# Patient Record
Sex: Female | Born: 1980 | Race: Black or African American | Hispanic: No | Marital: Single | Smoking: Former smoker
Health system: Southern US, Community
[De-identification: ages and names within clinical notes are randomized; demographics above are authoritative.]

## PROBLEM LIST (undated history)

## (undated) DIAGNOSIS — Z789 Other specified health status: Secondary | ICD-10-CM

## (undated) DIAGNOSIS — E119 Type 2 diabetes mellitus without complications: Secondary | ICD-10-CM

## (undated) DIAGNOSIS — R87629 Unspecified abnormal cytological findings in specimens from vagina: Secondary | ICD-10-CM

## (undated) DIAGNOSIS — Z5189 Encounter for other specified aftercare: Secondary | ICD-10-CM

## (undated) DIAGNOSIS — O139 Gestational [pregnancy-induced] hypertension without significant proteinuria, unspecified trimester: Secondary | ICD-10-CM

---

## 2019-07-08 NOTE — L&D Delivery Note (Signed)
Delivery Note Pt pushed for three sets of contractions over and at 4:40 PM a viable female was delivered via Vaginal, Spontaneous (Presentation: Left Occiput Anterior).  APGAR:pending per NICU , ; weight pending  After delivery of infants head, no nuchal was noted. Anterior and posterior shoulders followed then body easily next.  .   Placenta status: Spontaneous;Expressed;Schultz. Pathology, Intact.  Cord: 3 vessels with the following complications: None.  Cord pH: n/a  Anesthesia: Epidural Episiotomy: None Lacerations: None Suture Repair: n/a Est. Blood Loss (mL): 100  Mom to postpartum.  Baby to NICU  .  Debra Roach 03/06/2020, 4:51 PM

## 2019-09-08 LAB — OB RESULTS CONSOLE GC/CHLAMYDIA
Chlamydia: NEGATIVE
Gonorrhea: NEGATIVE

## 2020-01-26 LAB — OB RESULTS CONSOLE RPR: RPR: NONREACTIVE

## 2020-01-26 LAB — OB RESULTS CONSOLE HIV ANTIBODY (ROUTINE TESTING): HIV: NONREACTIVE

## 2020-03-01 ENCOUNTER — Inpatient Hospital Stay (HOSPITAL_COMMUNITY)
Admission: AD | Admit: 2020-03-01 | Discharge: 2020-03-09 | DRG: 805 | Disposition: A | Discharge status: Home / Self Care | Payer: Medicaid Other | Attending: Obstetrics and Gynecology | Admitting: Obstetrics and Gynecology

## 2020-03-01 ENCOUNTER — Other Ambulatory Visit: Payer: Self-pay

## 2020-03-01 ENCOUNTER — Encounter (HOSPITAL_COMMUNITY): Payer: Self-pay | Admitting: Obstetrics and Gynecology

## 2020-03-01 ENCOUNTER — Inpatient Hospital Stay (HOSPITAL_COMMUNITY)
Admission: AD | Admit: 2020-03-01 | Discharge: 2020-03-01 | Disposition: A | Discharge status: Home / Self Care | Payer: Medicaid Other | Attending: Obstetrics and Gynecology | Admitting: Obstetrics and Gynecology

## 2020-03-01 DIAGNOSIS — O42913 Preterm premature rupture of membranes, unspecified as to length of time between rupture and onset of labor, third trimester: Secondary | ICD-10-CM | POA: Diagnosis present

## 2020-03-01 DIAGNOSIS — Z794 Long term (current) use of insulin: Secondary | ICD-10-CM | POA: Diagnosis not present

## 2020-03-01 DIAGNOSIS — O4290 Premature rupture of membranes, unspecified as to length of time between rupture and onset of labor, unspecified weeks of gestation: Secondary | ICD-10-CM | POA: Diagnosis not present

## 2020-03-01 DIAGNOSIS — Z3A33 33 weeks gestation of pregnancy: Secondary | ICD-10-CM | POA: Diagnosis not present

## 2020-03-01 DIAGNOSIS — O134 Gestational [pregnancy-induced] hypertension without significant proteinuria, complicating childbirth: Secondary | ICD-10-CM | POA: Diagnosis present

## 2020-03-01 DIAGNOSIS — O09293 Supervision of pregnancy with other poor reproductive or obstetric history, third trimester: Secondary | ICD-10-CM | POA: Diagnosis not present

## 2020-03-01 DIAGNOSIS — Z87891 Personal history of nicotine dependence: Secondary | ICD-10-CM | POA: Diagnosis not present

## 2020-03-01 DIAGNOSIS — O411230 Chorioamnionitis, third trimester, not applicable or unspecified: Secondary | ICD-10-CM | POA: Diagnosis present

## 2020-03-01 DIAGNOSIS — O09213 Supervision of pregnancy with history of pre-term labor, third trimester: Secondary | ICD-10-CM | POA: Diagnosis not present

## 2020-03-01 DIAGNOSIS — E119 Type 2 diabetes mellitus without complications: Secondary | ICD-10-CM | POA: Diagnosis present

## 2020-03-01 DIAGNOSIS — O864 Pyrexia of unknown origin following delivery: Secondary | ICD-10-CM

## 2020-03-01 DIAGNOSIS — O2412 Pre-existing diabetes mellitus, type 2, in childbirth: Secondary | ICD-10-CM | POA: Diagnosis present

## 2020-03-01 DIAGNOSIS — O24113 Pre-existing diabetes mellitus, type 2, in pregnancy, third trimester: Secondary | ICD-10-CM | POA: Diagnosis not present

## 2020-03-01 DIAGNOSIS — Z20822 Contact with and (suspected) exposure to covid-19: Secondary | ICD-10-CM | POA: Diagnosis present

## 2020-03-01 DIAGNOSIS — O42919 Preterm premature rupture of membranes, unspecified as to length of time between rupture and onset of labor, unspecified trimester: Secondary | ICD-10-CM | POA: Diagnosis present

## 2020-03-01 HISTORY — DX: Other specified health status: Z78.9

## 2020-03-01 HISTORY — DX: Encounter for other specified aftercare: Z51.89

## 2020-03-01 HISTORY — DX: Gestational (pregnancy-induced) hypertension without significant proteinuria, unspecified trimester: O13.9

## 2020-03-01 HISTORY — DX: Type 2 diabetes mellitus without complications: E11.9

## 2020-03-01 HISTORY — DX: Unspecified abnormal cytological findings in specimens from vagina: R87.629

## 2020-03-01 LAB — URIC ACID: Uric Acid, Serum: 4.6 mg/dL (ref 2.5–7.1)

## 2020-03-01 LAB — PROTEIN / CREATININE RATIO, URINE
Creatinine, Urine: 25.2 mg/dL
Total Protein, Urine: <6 mg/dL

## 2020-03-01 LAB — COMPREHENSIVE METABOLIC PANEL
ALT: 12 U/L (ref 0–44)
AST: 12 U/L — ABNORMAL LOW (ref 15–41)
Albumin: 2.6 g/dL — ABNORMAL LOW (ref 3.5–5.0)
Alkaline Phosphatase: 85 U/L (ref 38–126)
Anion gap: 12 (ref 5–15)
BUN: 5 mg/dL — ABNORMAL LOW (ref 6–20)
CO2: 20 mmol/L — ABNORMAL LOW (ref 22–32)
Calcium: 8.6 mg/dL — ABNORMAL LOW (ref 8.9–10.3)
Chloride: 104 mmol/L (ref 98–111)
Creatinine, Ser: 0.46 mg/dL (ref 0.44–1.00)
GFR calc Af Amer: >60 mL/min (ref >60)
GFR calc non Af Amer: >60 mL/min (ref >60)
Glucose, Bld: 76 mg/dL (ref 70–99)
Potassium: 3.5 mmol/L (ref 3.5–5.1)
Sodium: 136 mmol/L (ref 135–145)
Total Bilirubin: 0.4 mg/dL (ref 0.3–1.2)
Total Protein: 6.5 g/dL (ref 6.5–8.1)

## 2020-03-01 LAB — GLUCOSE, CAPILLARY
Glucose-Capillary: 67 mg/dL — ABNORMAL LOW (ref 70–99)
Glucose-Capillary: 150 mg/dL — ABNORMAL HIGH (ref 70–99)

## 2020-03-01 LAB — CBC
HCT: 36 % (ref 36.0–46.0)
Hemoglobin: 11.7 g/dL — ABNORMAL LOW (ref 12.0–15.0)
MCH: 26.6 pg (ref 26.0–34.0)
MCHC: 32.5 g/dL (ref 30.0–36.0)
MCV: 81.8 fL (ref 80.0–100.0)
Platelets: 296 10*3/uL (ref 150–400)
RBC: 4.4 MIL/uL (ref 3.87–5.11)
RDW: 14.3 % (ref 11.5–15.5)
WBC: 9.5 10*3/uL (ref 4.0–10.5)
nRBC: 0 % (ref 0.0–0.2)

## 2020-03-01 LAB — TYPE AND SCREEN
ABO/RH(D): O POS
Antibody Screen: NEGATIVE

## 2020-03-01 LAB — LACTATE DEHYDROGENASE: LDH: 114 U/L (ref 98–192)

## 2020-03-01 MED ORDER — LABETALOL HCL 5 MG/ML IV SOLN: 40.0000 mg | INTRAVENOUS | Status: DC | PRN

## 2020-03-01 MED ORDER — DOCUSATE SODIUM 100 MG PO CAPS
100.0000 mg | ORAL_CAPSULE | Freq: Every day | ORAL | Status: DC
  Administered 2020-03-04: 100 mg via ORAL
  Filled 2020-03-01: qty 1

## 2020-03-01 MED ORDER — LACTATED RINGERS IV SOLN: INTRAVENOUS | Status: DC

## 2020-03-01 MED ORDER — AZITHROMYCIN 250 MG PO TABS
1000.0000 mg | ORAL_TABLET | Freq: Once | ORAL | Status: AC
  Administered 2020-03-01: 1000 mg via ORAL
  Filled 2020-03-01: qty 4

## 2020-03-01 MED ORDER — INSULIN ASPART 100 UNIT/ML ~~LOC~~ SOLN: 0.0000 [IU] | Freq: Three times a day (TID) | SUBCUTANEOUS | Status: DC

## 2020-03-01 MED ORDER — INSULIN ASPART 100 UNIT/ML ~~LOC~~ SOLN
3.0000 [IU] | Freq: Three times a day (TID) | SUBCUTANEOUS | Status: DC
  Administered 2020-03-01: 9 [IU] via SUBCUTANEOUS

## 2020-03-01 MED ORDER — INSULIN DETEMIR 100 UNIT/ML ~~LOC~~ SOLN
85.0000 [IU] | Freq: Every day | SUBCUTANEOUS | Status: DC
  Administered 2020-03-02 – 2020-03-05 (×5): 85 [IU] via SUBCUTANEOUS
  Filled 2020-03-01 (×5): qty 0.85

## 2020-03-01 MED ORDER — LABETALOL HCL 5 MG/ML IV SOLN: 80.0000 mg | INTRAVENOUS | Status: DC | PRN

## 2020-03-01 MED ORDER — BETAMETHASONE SOD PHOS & ACET 6 (3-3) MG/ML IJ SUSP
12.0000 mg | INTRAMUSCULAR | Status: AC
  Administered 2020-03-01 – 2020-03-02 (×2): 12 mg via INTRAMUSCULAR
  Filled 2020-03-01: qty 5

## 2020-03-01 MED ORDER — ACETAMINOPHEN 325 MG PO TABS
650.0000 mg | ORAL_TABLET | ORAL | Status: DC | PRN
  Administered 2020-03-01 – 2020-03-04 (×5): 650 mg via ORAL
  Filled 2020-03-01 (×5): qty 2

## 2020-03-01 MED ORDER — MAGNESIUM SULFATE 40 GM/1000ML IV SOLN
INTRAVENOUS | Status: AC
  Filled 2020-03-01: qty 1000

## 2020-03-01 MED ORDER — INSULIN ASPART 100 UNIT/ML ~~LOC~~ SOLN
0.0000 [IU] | Freq: Three times a day (TID) | SUBCUTANEOUS | Status: DC
  Administered 2020-03-02: 8 [IU] via SUBCUTANEOUS
  Administered 2020-03-02: 6 [IU] via SUBCUTANEOUS

## 2020-03-01 MED ORDER — INSULIN ASPART 100 UNIT/ML ~~LOC~~ SOLN: 3.0000 [IU] | Freq: Three times a day (TID) | SUBCUTANEOUS | Status: DC

## 2020-03-01 MED ORDER — AMOXICILLIN 500 MG PO CAPS
500.0000 mg | ORAL_CAPSULE | Freq: Three times a day (TID) | ORAL | Status: DC
  Administered 2020-03-03 – 2020-03-05 (×7): 500 mg via ORAL
  Filled 2020-03-01 (×7): qty 1

## 2020-03-01 MED ORDER — LABETALOL HCL 5 MG/ML IV SOLN
20.0000 mg | INTRAVENOUS | Status: DC | PRN
  Administered 2020-03-03: 20 mg via INTRAVENOUS
  Filled 2020-03-01: qty 4

## 2020-03-01 MED ORDER — HYDRALAZINE HCL 20 MG/ML IJ SOLN: 10.0000 mg | INTRAMUSCULAR | Status: DC | PRN

## 2020-03-01 MED ORDER — MAGNESIUM SULFATE 40 GM/1000ML IV SOLN
2.0000 g/h | INTRAVENOUS | Status: AC
  Administered 2020-03-02: 2 g/h via INTRAVENOUS
  Filled 2020-03-01: qty 1000

## 2020-03-01 MED ORDER — SODIUM CHLORIDE 0.9 % IV SOLN
2.0000 g | Freq: Four times a day (QID) | INTRAVENOUS | Status: AC
  Administered 2020-03-01 – 2020-03-03 (×8): 2 g via INTRAVENOUS
  Filled 2020-03-01 (×5): qty 2000
  Filled 2020-03-01: qty 2
  Filled 2020-03-01 (×5): qty 2000

## 2020-03-01 MED ORDER — INSULIN DETEMIR 100 UNIT/ML ~~LOC~~ SOLN
75.0000 [IU] | Freq: Two times a day (BID) | SUBCUTANEOUS | Status: DC
  Filled 2020-03-01 (×2): qty 0.75

## 2020-03-01 MED ORDER — METFORMIN HCL ER 500 MG PO TB24
500.0000 mg | ORAL_TABLET | Freq: Two times a day (BID) | ORAL | Status: DC
  Administered 2020-03-02 – 2020-03-05 (×8): 500 mg via ORAL
  Filled 2020-03-01 (×10): qty 1

## 2020-03-01 MED ORDER — PRENATAL MULTIVITAMIN CH
1.0000 | ORAL_TABLET | Freq: Every day | ORAL | Status: DC
  Administered 2020-03-02 – 2020-03-05 (×4): 1 via ORAL
  Filled 2020-03-01 (×4): qty 1

## 2020-03-01 MED ORDER — CALCIUM CARBONATE ANTACID 500 MG PO CHEW: 2.0000 | CHEWABLE_TABLET | ORAL | Status: DC | PRN

## 2020-03-01 MED ORDER — INSULIN DETEMIR 100 UNIT/ML ~~LOC~~ SOLN
75.0000 [IU] | Freq: Every day | SUBCUTANEOUS | Status: DC
  Administered 2020-03-02 – 2020-03-05 (×4): 75 [IU] via SUBCUTANEOUS
  Filled 2020-03-01 (×5): qty 0.75

## 2020-03-01 MED ORDER — MAGNESIUM SULFATE BOLUS VIA INFUSION
6.0000 g | Freq: Once | INTRAVENOUS | Status: AC
  Administered 2020-03-01: 6 g via INTRAVENOUS
  Filled 2020-03-01: qty 1000

## 2020-03-01 MED ORDER — ZOLPIDEM TARTRATE 5 MG PO TABS: 5.0000 mg | ORAL_TABLET | Freq: Every evening | ORAL | Status: DC | PRN

## 2020-03-01 NOTE — Consult Note (Signed)
[facility]  Prenatal Consult       03/01/2020  9:32 PM   I was asked by Dr. Mindi Slicker to consult on this patient for possible preterm delivery.  I had the pleasure of meeting with Ms. Debra Roach today.  She is a 40 y.I.[id number] with pregnancy complicated by T2DM (insulin and metformin) and PIH.  She PPROM'ed today at 33+2 and is being admitted for monitoring until delivery as well as betamethasone, neuro protective magnesium, and antibiotic administration.   She has had a prior preterm delivery at 30 weeks.     I explained that the neonatal intensive care team would be present for the delivery and that her daughter would automatically be admitted to the NICU if born prior to [redacted] weeks gestation.  We briefly outlined the likely delivery room course for this baby including routine resuscitation and NRP-guided approaches to the treatment of respiratory distress. We discussed other common problems associated with late prematurity, including respiratory distress syndrome/CLD, apnea, feeding issues, temperature regulation, and infection risk.   We discussed the average length of stay but I noted that the actual LOS would depend on the severity of problems encountered and response to treatments.  We discussed visitation policies and the resources available while her child is in the hospital.  We discussed the importance of good nutrition and various methods of providing nutrition (parenteral hyperalimentation, gavage feedings and/or oral feeding). We discussed the benefits of human milk. I encouraged breast feeding and pumping soon after birth and outlined resources that are available to support breast feeding.   Thank you for involving [place] in the care of this patient. A member of our team will be available should the family have additional questions.  Time for consultation approximately 20 minutes.   _____________________ Electronically Signed By: Karie Schwalbe, MD, MS Neonatologist

## 2020-03-01 NOTE — Progress Notes (Signed)
No history of surgery

## 2020-03-01 NOTE — Progress Notes (Signed)
CBC not resulted

## 2020-03-01 NOTE — Progress Notes (Signed)
Pt arrived to floor ambulatory after driving to hospital from MD's office. Pt is 39yr old G3P2 at 76 weeks with an [facility] of April 17, 2020.  Pt reports that she is a Type 2 diabetic on insulin and metformin. Pt reports that she has not had a meal since this am and had a snack at 1245 of cheese stick and applesauce. Pt states that she had been diagnosis as gestational hypertension with current admission from MD today of pre eclampsia. Pt reports she has been Type 2 diabetic x 15 years and takes Levemir and metformin daily. Bedside glucose 67. Pt reports current frontal headache, denies visual changes, spots or flashes of light, denies epigastric pain. Pt reports last HgbA1c 6.6 the beginning of August. Pt reports SROM at 1130 this morning-clear fluid-denies foul odor. Uterus is soft and non tender to palpation. Pt reports + fetal movement. Pt denies vaginal bleeding or bloody show. External fetal and labor monitors applied. Pt is calm and denies pain, cramping or contractions.

## 2020-03-01 NOTE — H&P (Addendum)
Debra Roach is a 39 y.B.[id number]  female presenting for PPROM, preterm contractions, Type 2 DM and elevated BP with HA.  Pt was seen in office today where Debra Roach was confirmed to be PPROM via gross pooling and positive nitrazine. On exam Debra Roach was 1cm dilated. Pt reported HA and elevated BP; history of preE in previous pregnancy and ghtn with this pregnancy. Pt is also type  2 diabetic - managed by Dr Talmage Nap; on metformin 500mg  po bid, levemir 75mg  bid and 3-10u novolog q meals prn with moderate control. Debra Roach has a history of preterm delivery - was on prometrium. Debra Roach is  VZNI.  Pt is dated per 8 week .  OB History    Gravida  4   Para  2   Term  1   Preterm  1   AB  1   Living  2     SAB  1   TAB  0   Ectopic  0   Multiple  0   Live Births  2          Past Medical History:  Diagnosis Date  . Blood transfusion without reported diagnosis    following miscarriage 2007  . Diabetes mellitus without complication (HCC)    Type 2 diabetes x 15 years  . Medical history non-contributory   . Pregnancy induced hypertension   . Preterm labor    history of preterm delivery at 30 weeks with 1st child  . Vaginal Pap smear, abnormal    in high school not surgrically treated   History reviewed. No pertinent surgical history. Family History: family history is not on file. Social History:  reports that Debra Roach quit smoking about 6 months ago. Debra Roach has never used smokeless tobacco. Debra Roach reports that Debra Roach does not drink alcohol and does not use drugs.     Maternal Diabetes: Yes:  Diabetes Type:  Pre-pregnancy, Insulin/Medication controlled Genetic Screening: Normal Maternal Ultrasounds/Referrals: Normal Fetal Ultrasounds or other Referrals:  Fetal echo Maternal Substance Abuse:  No Significant Maternal Medications:  Meds include: Other: see HPI Significant Maternal Lab Results:  Other: GBS unk Other Comments:  None  Review of Systems  Constitutional: Positive for activity change,  appetite change and fatigue. Negative for fever.  HENT: Negative for congestion.   Eyes: Positive for photophobia.  Respiratory: Negative for chest tightness and shortness of breath.   Cardiovascular: Negative for chest pain, palpitations and leg swelling.  Gastrointestinal: Positive for abdominal pain.  Musculoskeletal: Positive for myalgias.  Neurological: Positive for headaches.  Psychiatric/Behavioral: The patient is not nervous/anxious.    Maternal Medical History:  Reason for admission: Rupture of membranes.  Headache with elevated BP  Contractions: Onset was 13-24 hours ago.   Frequency: irregular.   Perceived severity is moderate.    Fetal activity: Perceived fetal activity is normal.   Last perceived fetal movement was within the past hour.    Prenatal complications: PIH and preterm labor.   Prenatal Complications - Diabetes: type 2. Diabetes is managed by insulin injections and oral agent (monotherapy).        Blood pressure 139/68, pulse 81, temperature 98.9 F (37.2 C), temperature source Oral, resp. rate 18, height 5\' 6"  (1.676 m), weight 89.9 kg, SpO2 99 %. Maternal Exam:  Uterine Assessment: Contraction strength is mild.  Contraction frequency is irregular.   Abdomen: Patient reports generalized tenderness.  Estimated fetal weight is AGA.   Fetal presentation: vertex  Introitus: Normal vulva. Vulva is negative for condylomata and lesion.  Normal vagina.  Vagina is negative for condylomata and discharge.  Nitrazine test: positive. Amniotic fluid character: clear.  Pelvis: adequate for delivery.   Cervix: Cervix evaluated by digital exam.     Fetal Exam Fetal Monitor Review: Baseline rate: 130.  Variability: moderate (6-25 bpm).   Pattern: accelerations present and no decelerations.    Fetal State Assessment: Category I - tracings are normal.     Physical Exam Vitals and nursing note reviewed. Exam conducted with a chaperone present.   Constitutional:      General: Debra Roach is not in acute distress.    Appearance: Normal appearance. Debra Roach is normal weight.  Cardiovascular:     Pulses: Normal pulses.  Pulmonary:     Effort: Pulmonary effort is normal.  Abdominal:     Tenderness: There is generalized abdominal tenderness.  Genitourinary:    General: Normal vulva.  Vulva is no lesion.     Vagina: No vaginal discharge.  Musculoskeletal:        General: Normal range of motion.     Cervical back: Normal range of motion.  Skin:    General: Skin is warm and dry.     Capillary Refill: Capillary refill takes 2 to 3 seconds.  Neurological:     General: No focal deficit present.     Mental Status: Debra Roach is alert and oriented to person, place, and time.  Psychiatric:        Mood and Affect: Mood normal.        Behavior: Behavior normal.        Thought Content: Thought content normal.        Judgment: Judgment normal.     Prenatal labs: ABO, Rh:   Antibody:   Rubella:   RPR:    HBsAg:    HIV:    GBS:     Assessment/Plan: 39yo [id number] female at 5 2/7wks with PPROM, type 2DM, ghtn, hx preterm labor - Admit - MgSO4 : 6gm bolus ( 1636) then run at 2gm/hr for CP prophylaxis ( started at 1811) - stop after 24hrs - Antibiotics for infection prevention prophylactically given PPROM - amp/zithro x 7 days - Parameters for preE mgmt : labs ordered and labetalol protocol if indicated - NICU consult - possible preterm delivery - MFM consult - [place]  - Fetal monitoring q shift and prn  - Diabetic mgmt - meds, consult to coordinator and dietitian - BMZ today and tomorrow ( 1800) - GBS obtained - Close monitoring    Janean Sark Codey Burling 03/01/2020, 7:00 PM

## 2020-03-01 NOTE — Progress Notes (Signed)
Patient ID: Debra Roach, female   DOB: 03-04-1981, 39 y.o.   MRN: [id number] Pt doing well. Reports back pain improving. No complaints. +FMs. Boyfriend received letter and is now n route to [place] VSS: 135/65 GEN - NAD  EFM - cat 1 TOCO - no contractions noted  ALT - 12; AST - 12.  LDH 114,  9.5>11.7<296  GLucose 150  A/P: 39yo [id number] at 33 2/7wks with PPROM, Type 2DM - stable on MgSO4 for CP prophylaxis and s/p BMZ x 1         Continue plan per H/P

## 2020-03-02 ENCOUNTER — Inpatient Hospital Stay (HOSPITAL_BASED_OUTPATIENT_CLINIC_OR_DEPARTMENT_OTHER): Payer: Medicaid Other

## 2020-03-02 DIAGNOSIS — O4290 Premature rupture of membranes, unspecified as to length of time between rupture and onset of labor, unspecified weeks of gestation: Secondary | ICD-10-CM | POA: Diagnosis not present

## 2020-03-02 DIAGNOSIS — O24113 Pre-existing diabetes mellitus, type 2, in pregnancy, third trimester: Secondary | ICD-10-CM

## 2020-03-02 DIAGNOSIS — O09213 Supervision of pregnancy with history of pre-term labor, third trimester: Secondary | ICD-10-CM

## 2020-03-02 DIAGNOSIS — O09293 Supervision of pregnancy with other poor reproductive or obstetric history, third trimester: Secondary | ICD-10-CM | POA: Diagnosis not present

## 2020-03-02 DIAGNOSIS — Z3A33 33 weeks gestation of pregnancy: Secondary | ICD-10-CM

## 2020-03-02 DIAGNOSIS — O133 Gestational [pregnancy-induced] hypertension without significant proteinuria, third trimester: Secondary | ICD-10-CM

## 2020-03-02 LAB — GLUCOSE, CAPILLARY
Glucose-Capillary: 215 mg/dL — ABNORMAL HIGH (ref 70–99)
Glucose-Capillary: 224 mg/dL — ABNORMAL HIGH (ref 70–99)
Glucose-Capillary: 250 mg/dL — ABNORMAL HIGH (ref 70–99)
Glucose-Capillary: 253 mg/dL — ABNORMAL HIGH (ref 70–99)
Glucose-Capillary: 316 mg/dL — ABNORMAL HIGH (ref 70–99)

## 2020-03-02 LAB — SARS CORONAVIRUS 2 BY RT PCR (HOSPITAL ORDER, PERFORMED IN ~~LOC~~ HOSPITAL LAB): SARS Coronavirus 2: NEGATIVE

## 2020-03-02 MED ORDER — INSULIN ASPART 100 UNIT/ML ~~LOC~~ SOLN
0.0000 [IU] | Freq: Three times a day (TID) | SUBCUTANEOUS | Status: DC
  Administered 2020-03-03: 5 [IU] via SUBCUTANEOUS

## 2020-03-02 MED ORDER — INSULIN ASPART 100 UNIT/ML ~~LOC~~ SOLN
8.0000 [IU] | Freq: Three times a day (TID) | SUBCUTANEOUS | Status: DC
  Administered 2020-03-02 (×2): 8 [IU] via SUBCUTANEOUS

## 2020-03-02 MED ORDER — INSULIN ASPART 100 UNIT/ML ~~LOC~~ SOLN
10.0000 [IU] | Freq: Three times a day (TID) | SUBCUTANEOUS | Status: DC
  Administered 2020-03-02 – 2020-03-04 (×5): 10 [IU] via SUBCUTANEOUS

## 2020-03-02 NOTE — Progress Notes (Signed)
Initial Nutrition Assessment  DOCUMENTATION CODES:  Not applicable  INTERVENTION:  Gestational diabetic carbohydrate modified diet May order double protein portions  NUTRITION DIAGNOSIS:  Altered nutrition lab value related to  (Hx of DM II and BMZ) as evidenced by  (lab values/ glucose).  GOAL:  Patient will meet greater than or equal to 90% of their needs  MONITOR:  Labs, Weight trends  REASON FOR ASSESSMENT:  Gestational Diabetes, Antenatal    ASSESSMENT:  33 3/7 weeks, adn with PROM, Hx of DMII. pre-preg weight 163 lbs, BMI 26.3. 35 lb weight gain to date. Elevated glucose levels currently due to BMZ. Has had outpt endocrine follow-up  Pt in house until delivery. Has no questions about diet. Reports she is comfortable counting CHO's and is able to relay CHO diet limits to me. Had no problems ordering meals. Appetite excellent this morning    Diet Order:   Diet Order            Diet gestational carb mod Room service appropriate? Yes; Fluid consistency: Thin  Diet effective now                EDUCATION NEEDS:   No education needs have been identified at this time  Skin:  Skin Assessment: Reviewed RN Assessment  Height:   Ht Readings from Last 1 Encounters:  03/01/20 5\' 6"  (1.676 m)    Weight:   Wt Readings from Last 1 Encounters:  03/01/20 89.9 kg   Ideal Body Weight:     BMI:  Body mass index is 31.99 kg/m.  Estimated Nutritional Needs:   Kcal:  2000-2200  Protein:  90-100 g  Fluid:  2.3 L  03/03/20 M.M LDN Neonatal Nutrition Support Specialist/RD III

## 2020-03-02 NOTE — Progress Notes (Addendum)
Patient ID: Debra Roach, female   DOB: [birth year], 39 y.o.   MRN: [id number]  PPROM 33+3, Also GDM  +FM, cont LOF, clear, no VB, occ ctx, pressure.  This am had lots of cramping pain in back for 10+ minute period, noe releived.  Going for [place] this AM.  Mild HA this AM hasn't eaten, usually drinks coffee in AM.  AFVSS (124-164/60-99 - now 134/69) gen NAD FHTs 120's, mod var, + accels, category 1 toco q 7-107min  Cont current mgmt Getting BMZ Getting Magnesium for CP prophylaxis [place] this AM Sugars elevated - will add sliding scale insulin Close monitoring

## 2020-03-02 NOTE — Progress Notes (Signed)
Inpatient Diabetes Program Recommendations  AACE/ADA: New Consensus Statement on Inpatient Glycemic Control (2015)  Target Ranges:  Prepandial:   less than 140 mg/dL      Peak postprandial:   less than 180 mg/dL (1-2 hours)      Critically ill patients:  140 - 180 mg/dL   Lab Results  Component Value Date   GLUCAP 224 (H) 03/02/2020    Review of Glycemic Control Results for HINDY, PERRAULT (MRN [id number]) as of 03/02/2020 10:18  Ref. Range 03/01/2020 20:37 03/02/2020 00:34 03/02/2020 06:39  Glucose-Capillary Latest Ref Range: 70 - 99 mg/dL 431 (H) 540 (H) 086 (H)   Diabetes history: Type 2 DM Outpatient Diabetes medications: Levemir 75 units QD, 85 units QHS, Metformin 500 mg BID,  Novolog TID meal coverage: (70-100) 8 units, (100-125) 9 units, (126-150) 10 units, (+150) 10+1 units for every 25 above Current orders for Inpatient glycemic control: Levemir 75 units QAM,, 85 units QHS, Novolog 3-10 units TID, Novolog 0-16 units TID, Metformin 500 mg BID  BMZ 12 mg X 1 (of 2) 8/26   Inpatient Diabetes Program Recommendations:    Noted elevated glucose trends, assuming related to steroids administration. Of note, patient missed AM dose of meal coverage with breakfast. Based on outpatient insulin needs for CHO coverage would recommend changing Novolog to 8 units TID (assuming patient is consuming >50% of meals).   Spoke with patient regarding outpatient diabetes management. Patient is followed by Dr Talmage Nap, endocrinology with last appointment on 8/4 with A1C 6.6%.  Patient reports taking a minimum of Novolog 8 units with meals based on her glucose trends prior to meal time and adjusts based on CBG range. Per patient report, she has done much better in pregnancy with glucose control than prior.  Reviewed patho of DM in pregnancy, impact of hormonal fluctuations on glucose trends, steroids, vascular changes and commorbidities. Encouraged to continue monitoring and follow up with Dr Talmage Nap even  following delivery. Reviewed common reductions to insulin dosages and insulin needs following delivery of placenta.  Reviewed CHO intake and alottment per meal as well las some other alternatives for snacks while inpatient. Patient has no further questions at this time.  Discussed patient with Dr Ellyn Hack, orders updated.  Thanks, Lujean Rave, MSN, RNC-OB Diabetes Coordinator [phone] (8a-5p)

## 2020-03-03 LAB — CBC WITH DIFFERENTIAL/PLATELET
Abs Immature Granulocytes: 0.06 10*3/uL (ref 0.00–0.07)
Basophils Absolute: 0 10*3/uL (ref 0.0–0.1)
Basophils Relative: 0 %
Eosinophils Absolute: 0 10*3/uL (ref 0.0–0.5)
Eosinophils Relative: 0 %
HCT: 34.2 % — ABNORMAL LOW (ref 36.0–46.0)
Hemoglobin: 11 g/dL — ABNORMAL LOW (ref 12.0–15.0)
Immature Granulocytes: 1 %
Lymphocytes Relative: 16 %
Lymphs Abs: 1.7 10*3/uL (ref 0.7–4.0)
MCH: 26.6 pg (ref 26.0–34.0)
MCHC: 32.2 g/dL (ref 30.0–36.0)
MCV: 82.8 fL (ref 80.0–100.0)
Monocytes Absolute: 0.6 10*3/uL (ref 0.1–1.0)
Monocytes Relative: 6 %
Neutro Abs: 8.4 10*3/uL — ABNORMAL HIGH (ref 1.7–7.7)
Neutrophils Relative %: 77 %
Platelets: 313 10*3/uL (ref 150–400)
RBC: 4.13 MIL/uL (ref 3.87–5.11)
RDW: 14.5 % (ref 11.5–15.5)
WBC: 10.9 10*3/uL — ABNORMAL HIGH (ref 4.0–10.5)
nRBC: 0 % (ref 0.0–0.2)

## 2020-03-03 LAB — COMPREHENSIVE METABOLIC PANEL
ALT: 11 U/L (ref 0–44)
AST: 14 U/L — ABNORMAL LOW (ref 15–41)
Albumin: 2.6 g/dL — ABNORMAL LOW (ref 3.5–5.0)
Alkaline Phosphatase: 73 U/L (ref 38–126)
Anion gap: 10 (ref 5–15)
BUN: 9 mg/dL (ref 6–20)
CO2: 19 mmol/L — ABNORMAL LOW (ref 22–32)
Calcium: 8.5 mg/dL — ABNORMAL LOW (ref 8.9–10.3)
Chloride: 107 mmol/L (ref 98–111)
Creatinine, Ser: 0.51 mg/dL (ref 0.44–1.00)
GFR calc Af Amer: >60 mL/min (ref >60)
GFR calc non Af Amer: >60 mL/min (ref >60)
Glucose, Bld: 150 mg/dL — ABNORMAL HIGH (ref 70–99)
Potassium: 4 mmol/L (ref 3.5–5.1)
Sodium: 136 mmol/L (ref 135–145)
Total Bilirubin: 0.8 mg/dL (ref 0.3–1.2)
Total Protein: 6.1 g/dL — ABNORMAL LOW (ref 6.5–8.1)

## 2020-03-03 LAB — CULTURE, BETA STREP (GROUP B ONLY)

## 2020-03-03 LAB — OB RESULTS CONSOLE GBS: GBS: NEGATIVE

## 2020-03-03 LAB — PROTEIN / CREATININE RATIO, URINE
Creatinine, Urine: 35.78 mg/dL
Total Protein, Urine: <6 mg/dL

## 2020-03-03 LAB — GLUCOSE, CAPILLARY
Glucose-Capillary: 162 mg/dL — ABNORMAL HIGH (ref 70–99)
Glucose-Capillary: 208 mg/dL — ABNORMAL HIGH (ref 70–99)
Glucose-Capillary: 270 mg/dL — ABNORMAL HIGH (ref 70–99)
Glucose-Capillary: 256 mg/dL — ABNORMAL HIGH (ref 70–99)

## 2020-03-03 MED ORDER — INSULIN ASPART 100 UNIT/ML ~~LOC~~ SOLN
0.0000 [IU] | Freq: Three times a day (TID) | SUBCUTANEOUS | Status: DC
  Administered 2020-03-03 (×2): 8 [IU] via SUBCUTANEOUS
  Administered 2020-03-04: 3 [IU] via SUBCUTANEOUS
  Administered 2020-03-04: 5 [IU] via SUBCUTANEOUS
  Administered 2020-03-04: 2 [IU] via SUBCUTANEOUS
  Administered 2020-03-05 (×2): 3 [IU] via SUBCUTANEOUS

## 2020-03-03 MED ORDER — BUTORPHANOL TARTRATE 1 MG/ML IJ SOLN
2.0000 mg | Freq: Once | INTRAMUSCULAR | Status: AC
  Administered 2020-03-03: 2 mg via INTRAVENOUS
  Filled 2020-03-03: qty 2

## 2020-03-03 MED ORDER — INSULIN ASPART 100 UNIT/ML ~~LOC~~ SOLN: 0.0000 [IU] | Freq: Three times a day (TID) | SUBCUTANEOUS | Status: DC

## 2020-03-03 MED ORDER — PROMETHAZINE HCL 25 MG/ML IJ SOLN
12.5000 mg | Freq: Four times a day (QID) | INTRAMUSCULAR | Status: AC | PRN
  Administered 2020-03-03: 12.5 mg via INTRAVENOUS
  Filled 2020-03-03: qty 1

## 2020-03-03 NOTE — Progress Notes (Signed)
Patient ID: Debra Roach, female   DOB: 16-Jun-1981, 39 y.o.   MRN: [id number]  PPROM 33+4, also GDM  Overnight patient w increased pressure; no cervical change on exam.   Now w increased pain in head - rates 9/10 - pt states has H/o HA  AFVSS gen NAD FHTs 120-130, mod var. Category 1 toco irr ctx  +FM, occ gushes of fluid, no VB, occ ctx, some strong  Abd soft, gravid, NT  Will give stadol/phenergan for HA Watch sugars today closely, adjust as needed

## 2020-03-03 NOTE — Progress Notes (Signed)
Dr. Ellyn Hack notified of patient contracting and rating them 9/10. Instructed to LR fluid bolus. Carmelina Dane, RN

## 2020-03-03 NOTE — Progress Notes (Addendum)
Patient ID: Debra Roach, female   DOB: 1981/04/10, 39 y.o.   MRN: [id number]  RN called with 2 elevated BP.  Ordered CBC, CMP, Pr/Cr ratio. No PIH sx's other than HA.  Labs  Platelets stable LFTs WNL Pr/Cr ratio undetectable

## 2020-03-04 LAB — CBC
HCT: 33.2 % — ABNORMAL LOW (ref 36.0–46.0)
HCT: 34 % — ABNORMAL LOW (ref 36.0–46.0)
Hemoglobin: 10.6 g/dL — ABNORMAL LOW (ref 12.0–15.0)
Hemoglobin: 10.9 g/dL — ABNORMAL LOW (ref 12.0–15.0)
MCH: 26.1 pg (ref 26.0–34.0)
MCH: 26.3 pg (ref 26.0–34.0)
MCHC: 31.9 g/dL (ref 30.0–36.0)
MCHC: 32.1 g/dL (ref 30.0–36.0)
MCV: 81.8 fL (ref 80.0–100.0)
MCV: 82.1 fL (ref 80.0–100.0)
Platelets: 266 10*3/uL (ref 150–400)
Platelets: 282 10*3/uL (ref 150–400)
RBC: 4.06 MIL/uL (ref 3.87–5.11)
RBC: 4.14 MIL/uL (ref 3.87–5.11)
RDW: 14.6 % (ref 11.5–15.5)
RDW: 14.6 % (ref 11.5–15.5)
WBC: 9.1 10*3/uL (ref 4.0–10.5)
WBC: 10.4 10*3/uL (ref 4.0–10.5)
nRBC: 0 % (ref 0.0–0.2)
nRBC: 0 % (ref 0.0–0.2)

## 2020-03-04 LAB — COMPREHENSIVE METABOLIC PANEL
ALT: 10 U/L (ref 0–44)
AST: 12 U/L — ABNORMAL LOW (ref 15–41)
Albumin: 2.3 g/dL — ABNORMAL LOW (ref 3.5–5.0)
Alkaline Phosphatase: 78 U/L (ref 38–126)
Anion gap: 9 (ref 5–15)
BUN: 8 mg/dL (ref 6–20)
CO2: 20 mmol/L — ABNORMAL LOW (ref 22–32)
Calcium: 8.3 mg/dL — ABNORMAL LOW (ref 8.9–10.3)
Chloride: 103 mmol/L (ref 98–111)
Creatinine, Ser: 0.63 mg/dL (ref 0.44–1.00)
GFR calc Af Amer: >60 mL/min (ref >60)
GFR calc non Af Amer: >60 mL/min (ref >60)
Glucose, Bld: 130 mg/dL — ABNORMAL HIGH (ref 70–99)
Potassium: 3.7 mmol/L (ref 3.5–5.1)
Sodium: 132 mmol/L — ABNORMAL LOW (ref 135–145)
Total Bilirubin: 0.5 mg/dL (ref 0.3–1.2)
Total Protein: 5.8 g/dL — ABNORMAL LOW (ref 6.5–8.1)

## 2020-03-04 LAB — TYPE AND SCREEN
ABO/RH(D): O POS
Antibody Screen: NEGATIVE

## 2020-03-04 LAB — GLUCOSE, CAPILLARY
Glucose-Capillary: 163 mg/dL — ABNORMAL HIGH (ref 70–99)
Glucose-Capillary: 128 mg/dL — ABNORMAL HIGH (ref 70–99)
Glucose-Capillary: 153 mg/dL — ABNORMAL HIGH (ref 70–99)
Glucose-Capillary: 234 mg/dL — ABNORMAL HIGH (ref 70–99)

## 2020-03-04 LAB — PROTEIN / CREATININE RATIO, URINE
Creatinine, Urine: 93.3 mg/dL
Protein Creatinine Ratio: 0.09 mg/mg{Cre} (ref 0.00–0.15)
Total Protein, Urine: 8 mg/dL

## 2020-03-04 MED ORDER — HYDRALAZINE HCL 10 MG PO TABS: 10.0000 mg | ORAL_TABLET | Freq: Four times a day (QID) | ORAL | Status: DC | PRN

## 2020-03-04 MED ORDER — LABETALOL HCL 5 MG/ML IV SOLN: 40.0000 mg | INTRAVENOUS | Status: DC | PRN

## 2020-03-04 MED ORDER — LABETALOL HCL 5 MG/ML IV SOLN: 20.0000 mg | INTRAVENOUS | Status: DC | PRN

## 2020-03-04 MED ORDER — INSULIN ASPART 100 UNIT/ML ~~LOC~~ SOLN
12.0000 [IU] | Freq: Three times a day (TID) | SUBCUTANEOUS | Status: DC
  Administered 2020-03-04 – 2020-03-05 (×5): 12 [IU] via SUBCUTANEOUS

## 2020-03-04 MED ORDER — LABETALOL HCL 5 MG/ML IV SOLN: 80.0000 mg | INTRAVENOUS | Status: DC | PRN

## 2020-03-04 NOTE — Progress Notes (Addendum)
Patient ID: Debra Roach, female   DOB: [birth year], 39 y.o.   MRN: [id number]  33+5 PPROM/GDM  No c/o's, +FM, cont LOF, no VB, occ ctx; has intermittent HA and some RUQ pain. Will check labs.   Sugars not well-controlled after BMZ - adjust coverage/ d/w diabetic management.  Some pelvic pressure.    AF (133-150/58-70) 142/64 gen NAD Abd soft, FNT, gravid  FHTs 140's, Cherylin Mylar irr  [place] [id number] at 33+ with PPROM S/p Magnesium S/p BMZ On antibiotics for latency CBC/CMP, urine protein/Cr ratio

## 2020-03-04 NOTE — Progress Notes (Signed)
Inpatient Diabetes Program Recommendations  AACE/ADA: New Consensus Statement on Inpatient Glycemic Control (2015)  Target Ranges:  Prepandial:   less than 140 mg/dL      Peak postprandial:   less than 180 mg/dL (1-2 hours)      Critically ill patients:  140 - 180 mg/dL   Lab Results  Component Value Date   GLUCAP 128 (H) 03/04/2020  ADA Standards of Care 2021 Diabetes in Pregnancy Target Glucose Ranges:  Fasting: 60 - 90 mg/dL Preprandial: 60 - 357 mg/dL 1 hr postprandial: Less than 140mg /dL (from first bite of meal) 2 hr postprandial: Less than 120 mg/dL (from first bit of meal)  Review of Glycemic Control Results for Debra Roach, Debra Roach (MRN Shiela Mayer) as of 03/04/2020 13:21  Ref. Range 03/03/2020 07:17 03/03/2020 11:02 03/03/2020 15:34 03/03/2020 19:30 03/04/2020 08:46 03/04/2020 11:04  Glucose-Capillary Latest Ref Range: 70 - 99 mg/dL 03/06/2020 (H) 009 (H) 233 (H) 256 (H) 163 (H) 128 (H)  Diabetes history: Type 2 DM Outpatient Diabetes medications: Levemir 75 units QD, 85 units QHS, Metformin 500 mg BID,  Novolog TID meal coverage: (70-100) 8 units, (100-125) 9 units, (126-150) 10 units, (+150) 10+1 units for every 25 above Current orders for Inpatient glycemic control: Levemir 75 units QAM,, 85 units QHS, Novolog 3-10 units TID, Novolog 0-16 units TID, Metformin 500 mg BID  BMZ 12 mg X 2 (8/26 & 8/27).   Current orders:  Novolog 0-15 units 2 hours post prandial Novolog 12 units tid with meals (increased today) Levemir 75 units in the AM and 85 units at HS  Recommendations:  Agree with current orders. Meal coverage increased today.  Blood sugars seem to be improving post- BMZ.    Will follow.    Thanks  9/27, RN, BC-ADM Inpatient Diabetes Coordinator Pager [phone]

## 2020-03-04 NOTE — Plan of Care (Signed)
  Problem: Education: Goal: Knowledge of disease or condition will improve Outcome: Progressing   Problem: Education: Goal: Knowledge of the prescribed therapeutic regimen will improve Outcome: Progressing   Problem: Education: Goal: Knowledge of disease or condition will improve Outcome: Progressing

## 2020-03-05 LAB — GLUCOSE, CAPILLARY
Glucose-Capillary: 75 mg/dL (ref 70–99)
Glucose-Capillary: 191 mg/dL — ABNORMAL HIGH (ref 70–99)
Glucose-Capillary: 186 mg/dL — ABNORMAL HIGH (ref 70–99)
Glucose-Capillary: 114 mg/dL — ABNORMAL HIGH (ref 70–99)

## 2020-03-05 NOTE — Progress Notes (Signed)
Inpatient Diabetes Program Recommendations  Diabetes Treatment Program Recommendations  ADA Standards of Care 2018 Diabetes in Pregnancy Target Glucose Ranges:  Fasting: 60 - 90 mg/dL Preprandial: 60 - 657 mg/dL 1 hr postprandial: Less than 140mg /dL (from first bite of meal) 2 hr postprandial: Less than 120 mg/dL (from first bite of meal)    Lab Results  Component Value Date   GLUCAP 191 (H) 03/05/2020    Review of Glycemic Control Results for Debra, Roach (MRN Shiela Mayer) as of 03/05/2020 13:33  Ref. Range 03/04/2020 20:06 03/05/2020 08:05 03/05/2020 09:58  Glucose-Capillary Latest Ref Range: 70 - 99 mg/dL 03/07/2020 (H) 75 841 (H)   Diabetes history: Type 2 DM Outpatient Diabetes medications: Levemir 75 units QD, 85 units QHS, Metformin 500 mg BID,  Novolog TID meal coverage: (70-100) 8 units, (100-125) 9 units, (126-150) 10 units, (+150) 10+1 units for every 25 above Current orders for Inpatient glycemic control: Levemir 75 units QAM,, 85 units QHS, Novolog 12 units TID, Novolog 0-16 units TID, Metformin 500 mg BID  BMZ 12 mg X 2   Inpatient Diabetes Program Recommendations:    If plan is to continue with IOL tonight would consider the following:  -Holding QHS and QAM doses of Levemir and initiate IV insulin per Endotool for IV insulin.  Thanks, 324, MSN, RNC-OB Diabetes Coordinator [phone] (8a-5p)

## 2020-03-05 NOTE — Progress Notes (Signed)
AP PROGRESS NOTE  S: Feels overall well. Continued scant discharge, denies VB or foul odor. +FM, occ back pain. Denies PreE symptoms. Feels increased pelvic pressure compared to yesterday  O: BP 127/74 (BP Location: Right Arm)   Pulse 84   Temp 97.7 F (36.5 C) (Oral)   Resp 18   Ht 5\' 6"  (1.676 m)   Wt 89.9 kg   LMP  (Within Days)   SpO2 99%   BMI 31.99 kg/m  Daily NST reactive 8/29, +FM during leopolds, vertex on exam CE 1-2/70/-1  Labs: BG fasting 75, pp 191 PreE labs 8/30 WNL BP range 127-145/57-74  A/P 39yo 9/30 @ 33 6/7 admitted with PPROM. T2DM, short cervix on PV progesterone, AMA, h/o PreE  Will be 34 0/7 at midnight. Plan to move forward with IOL at that time. GBS culture negative on admission. VTX on exam today. S/p 24hrs MgSO4 and BMTZ course 8/26-27. Currently on amp/azithro course, VSS.  Will reassess in afternoon in preparation for move to L&D. Considering epidural.  T2DM: Levemir 75/85, Novolog 12u TID, BG slowing comign down after BMTZ as expected

## 2020-03-06 ENCOUNTER — Inpatient Hospital Stay (HOSPITAL_COMMUNITY): Payer: Medicaid Other

## 2020-03-06 ENCOUNTER — Inpatient Hospital Stay (HOSPITAL_COMMUNITY): Payer: Medicaid Other | Admitting: Anesthesiology

## 2020-03-06 ENCOUNTER — Encounter (HOSPITAL_COMMUNITY): Payer: Medicaid Other

## 2020-03-06 ENCOUNTER — Encounter (HOSPITAL_COMMUNITY): Payer: Self-pay | Admitting: Obstetrics and Gynecology

## 2020-03-06 LAB — PROTEIN / CREATININE RATIO, URINE
Creatinine, Urine: 95.65 mg/dL
Protein Creatinine Ratio: 0.08 mg/mg{Cre} (ref 0.00–0.15)
Total Protein, Urine: 8 mg/dL

## 2020-03-06 LAB — COMPREHENSIVE METABOLIC PANEL
ALT: 12 U/L (ref 0–44)
AST: 14 U/L — ABNORMAL LOW (ref 15–41)
Albumin: 2.3 g/dL — ABNORMAL LOW (ref 3.5–5.0)
Alkaline Phosphatase: 72 U/L (ref 38–126)
Anion gap: 11 (ref 5–15)
BUN: 7 mg/dL (ref 6–20)
CO2: 19 mmol/L — ABNORMAL LOW (ref 22–32)
Calcium: 8.9 mg/dL (ref 8.9–10.3)
Chloride: 104 mmol/L (ref 98–111)
Creatinine, Ser: 0.61 mg/dL (ref 0.44–1.00)
GFR calc Af Amer: >60 mL/min (ref >60)
GFR calc non Af Amer: >60 mL/min (ref >60)
Glucose, Bld: 324 mg/dL — ABNORMAL HIGH (ref 70–99)
Potassium: 3.6 mmol/L (ref 3.5–5.1)
Sodium: 134 mmol/L — ABNORMAL LOW (ref 135–145)
Total Bilirubin: 0.3 mg/dL (ref 0.3–1.2)
Total Protein: 5.5 g/dL — ABNORMAL LOW (ref 6.5–8.1)

## 2020-03-06 LAB — CBC WITH DIFFERENTIAL/PLATELET
Abs Immature Granulocytes: 0.07 10*3/uL (ref 0.00–0.07)
Basophils Absolute: 0 10*3/uL (ref 0.0–0.1)
Basophils Relative: 0 %
Eosinophils Absolute: 0 10*3/uL (ref 0.0–0.5)
Eosinophils Relative: 0 %
HCT: 34.9 % — ABNORMAL LOW (ref 36.0–46.0)
Hemoglobin: 11.4 g/dL — ABNORMAL LOW (ref 12.0–15.0)
Immature Granulocytes: 1 %
Lymphocytes Relative: 11 %
Lymphs Abs: 1.4 10*3/uL (ref 0.7–4.0)
MCH: 27.2 pg (ref 26.0–34.0)
MCHC: 32.7 g/dL (ref 30.0–36.0)
MCV: 83.3 fL (ref 80.0–100.0)
Monocytes Absolute: 0.7 10*3/uL (ref 0.1–1.0)
Monocytes Relative: 5 %
Neutro Abs: 10.7 10*3/uL — ABNORMAL HIGH (ref 1.7–7.7)
Neutrophils Relative %: 83 %
Platelets: 251 10*3/uL (ref 150–400)
RBC: 4.19 MIL/uL (ref 3.87–5.11)
RDW: 14.6 % (ref 11.5–15.5)
WBC: 13 10*3/uL — ABNORMAL HIGH (ref 4.0–10.5)
nRBC: 0 % (ref 0.0–0.2)

## 2020-03-06 LAB — GLUCOSE, CAPILLARY
Glucose-Capillary: 102 mg/dL — ABNORMAL HIGH (ref 70–99)
Glucose-Capillary: 105 mg/dL — ABNORMAL HIGH (ref 70–99)
Glucose-Capillary: 106 mg/dL — ABNORMAL HIGH (ref 70–99)
Glucose-Capillary: 107 mg/dL — ABNORMAL HIGH (ref 70–99)
Glucose-Capillary: 111 mg/dL — ABNORMAL HIGH (ref 70–99)
Glucose-Capillary: 111 mg/dL — ABNORMAL HIGH (ref 70–99)
Glucose-Capillary: 113 mg/dL — ABNORMAL HIGH (ref 70–99)
Glucose-Capillary: 119 mg/dL — ABNORMAL HIGH (ref 70–99)
Glucose-Capillary: 124 mg/dL — ABNORMAL HIGH (ref 70–99)
Glucose-Capillary: 128 mg/dL — ABNORMAL HIGH (ref 70–99)
Glucose-Capillary: 135 mg/dL — ABNORMAL HIGH (ref 70–99)
Glucose-Capillary: 140 mg/dL — ABNORMAL HIGH (ref 70–99)
Glucose-Capillary: 160 mg/dL — ABNORMAL HIGH (ref 70–99)
Glucose-Capillary: 161 mg/dL — ABNORMAL HIGH (ref 70–99)
Glucose-Capillary: 238 mg/dL — ABNORMAL HIGH (ref 70–99)
Glucose-Capillary: 285 mg/dL — ABNORMAL HIGH (ref 70–99)
Glucose-Capillary: 316 mg/dL — ABNORMAL HIGH (ref 70–99)
Glucose-Capillary: 77 mg/dL (ref 70–99)
Glucose-Capillary: 79 mg/dL (ref 70–99)
Glucose-Capillary: 84 mg/dL (ref 70–99)
Glucose-Capillary: 86 mg/dL (ref 70–99)
Glucose-Capillary: 87 mg/dL (ref 70–99)
Glucose-Capillary: 95 mg/dL (ref 70–99)

## 2020-03-06 LAB — CBC
HCT: 36 % (ref 36.0–46.0)
HCT: 34.7 % — ABNORMAL LOW (ref 36.0–46.0)
Hemoglobin: 11.6 g/dL — ABNORMAL LOW (ref 12.0–15.0)
Hemoglobin: 11.1 g/dL — ABNORMAL LOW (ref 12.0–15.0)
MCH: 26.9 pg (ref 26.0–34.0)
MCH: 26.5 pg (ref 26.0–34.0)
MCHC: 32.2 g/dL (ref 30.0–36.0)
MCHC: 32 g/dL (ref 30.0–36.0)
MCV: 83.3 fL (ref 80.0–100.0)
MCV: 82.8 fL (ref 80.0–100.0)
Platelets: 285 10*3/uL (ref 150–400)
Platelets: 278 10*3/uL (ref 150–400)
RBC: 4.32 MIL/uL (ref 3.87–5.11)
RBC: 4.19 MIL/uL (ref 3.87–5.11)
RDW: 14.5 % (ref 11.5–15.5)
RDW: 14.7 % (ref 11.5–15.5)
WBC: 12.2 10*3/uL — ABNORMAL HIGH (ref 4.0–10.5)
WBC: 10.5 10*3/uL (ref 4.0–10.5)
nRBC: 0 % (ref 0.0–0.2)
nRBC: 0 % (ref 0.0–0.2)

## 2020-03-06 LAB — BASIC METABOLIC PANEL
Anion gap: 12 (ref 5–15)
BUN: 7 mg/dL (ref 6–20)
CO2: 19 mmol/L — ABNORMAL LOW (ref 22–32)
Calcium: 8.7 mg/dL — ABNORMAL LOW (ref 8.9–10.3)
Chloride: 103 mmol/L (ref 98–111)
Creatinine, Ser: 0.44 mg/dL (ref 0.44–1.00)
GFR calc Af Amer: >60 mL/min (ref >60)
GFR calc non Af Amer: >60 mL/min (ref >60)
Glucose, Bld: 105 mg/dL — ABNORMAL HIGH (ref 70–99)
Potassium: 3.7 mmol/L (ref 3.5–5.1)
Sodium: 134 mmol/L — ABNORMAL LOW (ref 135–145)

## 2020-03-06 LAB — HEMOGLOBIN A1C
Hgb A1c MFr Bld: 6.8 % — ABNORMAL HIGH (ref 4.8–5.6)
Mean Plasma Glucose: 148.46 mg/dL

## 2020-03-06 LAB — LACTIC ACID, PLASMA: Lactic Acid, Venous: 2.7 mmol/L (ref 0.5–1.9)

## 2020-03-06 LAB — RPR: RPR Ser Ql: NONREACTIVE

## 2020-03-06 MED ORDER — EPHEDRINE 5 MG/ML INJ: 10.0000 mg | INTRAVENOUS | Status: DC | PRN

## 2020-03-06 MED ORDER — ONDANSETRON HCL 4 MG/2ML IJ SOLN: 4.0000 mg | Freq: Four times a day (QID) | INTRAMUSCULAR | Status: DC | PRN

## 2020-03-06 MED ORDER — LACTATED RINGERS IV BOLUS (SEPSIS)
1000.0000 mL | Freq: Once | INTRAVENOUS | Status: AC
  Administered 2020-03-06: 1000 mL via INTRAVENOUS

## 2020-03-06 MED ORDER — SOD CITRATE-CITRIC ACID 500-334 MG/5ML PO SOLN: 30.0000 mL | ORAL | Status: DC | PRN

## 2020-03-06 MED ORDER — FENTANYL-BUPIVACAINE-NACL 0.5-0.125-0.9 MG/250ML-% EP SOLN
12.0000 mL/h | EPIDURAL | Status: DC | PRN
  Filled 2020-03-06: qty 250

## 2020-03-06 MED ORDER — OXYCODONE-ACETAMINOPHEN 5-325 MG PO TABS: 2.0000 | ORAL_TABLET | ORAL | Status: DC | PRN

## 2020-03-06 MED ORDER — INSULIN DETEMIR 100 UNIT/ML ~~LOC~~ SOLN
35.0000 [IU] | Freq: Two times a day (BID) | SUBCUTANEOUS | Status: DC
  Filled 2020-03-06: qty 0.35

## 2020-03-06 MED ORDER — OXYTOCIN BOLUS FROM INFUSION
333.0000 mL | Freq: Once | INTRAVENOUS | Status: AC
  Administered 2020-03-06: 333 mL via INTRAVENOUS

## 2020-03-06 MED ORDER — OXYTOCIN-SODIUM CHLORIDE 30-0.9 UT/500ML-% IV SOLN
1.0000 m[IU]/min | INTRAVENOUS | Status: DC
  Administered 2020-03-06: 2 m[IU]/min via INTRAVENOUS
  Filled 2020-03-06: qty 500

## 2020-03-06 MED ORDER — ACETAMINOPHEN 500 MG PO TABS
1000.0000 mg | ORAL_TABLET | Freq: Once | ORAL | Status: AC
  Administered 2020-03-06: 1000 mg via ORAL
  Filled 2020-03-06: qty 2

## 2020-03-06 MED ORDER — INSULIN REGULAR(HUMAN) IN NACL 100-0.9 UT/100ML-% IV SOLN
INTRAVENOUS | Status: DC
  Administered 2020-03-06: 2.8 [IU]/h via INTRAVENOUS
  Administered 2020-03-06: 1.9 [IU]/h via INTRAVENOUS
  Filled 2020-03-06 (×3): qty 100

## 2020-03-06 MED ORDER — TERBUTALINE SULFATE 1 MG/ML IJ SOLN
0.2500 mg | Freq: Once | INTRAMUSCULAR | Status: DC | PRN
  Filled 2020-03-06: qty 1

## 2020-03-06 MED ORDER — PHENYLEPHRINE 40 MCG/ML (10ML) SYRINGE FOR IV PUSH (FOR BLOOD PRESSURE SUPPORT)
80.0000 ug | PREFILLED_SYRINGE | INTRAVENOUS | Status: DC | PRN
  Filled 2020-03-06: qty 10

## 2020-03-06 MED ORDER — SODIUM CHLORIDE 0.9 % IV SOLN
1.5000 g | Freq: Four times a day (QID) | INTRAVENOUS | Status: DC
  Filled 2020-03-06 (×3): qty 4

## 2020-03-06 MED ORDER — LACTATED RINGERS IV SOLN: 500.0000 mL | INTRAVENOUS | Status: DC | PRN

## 2020-03-06 MED ORDER — LACTATED RINGERS IV SOLN: INTRAVENOUS | Status: DC

## 2020-03-06 MED ORDER — LACTATED RINGERS IV SOLN
500.0000 mL | Freq: Once | INTRAVENOUS | Status: AC
  Administered 2020-03-06: 500 mL via INTRAVENOUS

## 2020-03-06 MED ORDER — LACTATED RINGERS IV BOLUS: 1000.0000 mL | Freq: Once | INTRAVENOUS | Status: DC

## 2020-03-06 MED ORDER — BUTORPHANOL TARTRATE 1 MG/ML IJ SOLN
1.0000 mg | INTRAMUSCULAR | Status: DC | PRN
  Administered 2020-03-06: 1 mg via INTRAVENOUS
  Filled 2020-03-06: qty 1

## 2020-03-06 MED ORDER — OXYTOCIN-SODIUM CHLORIDE 30-0.9 UT/500ML-% IV SOLN: 2.5000 [IU]/h | INTRAVENOUS | Status: DC

## 2020-03-06 MED ORDER — INSULIN ASPART 100 UNIT/ML ~~LOC~~ SOLN
1.0000 [IU] | SUBCUTANEOUS | Status: DC
  Administered 2020-03-06 (×2): 3 [IU] via SUBCUTANEOUS

## 2020-03-06 MED ORDER — PHENYLEPHRINE 40 MCG/ML (10ML) SYRINGE FOR IV PUSH (FOR BLOOD PRESSURE SUPPORT)
80.0000 ug | PREFILLED_SYRINGE | INTRAVENOUS | Status: DC | PRN
  Administered 2020-03-06: 80 ug via INTRAVENOUS

## 2020-03-06 MED ORDER — LACTATED RINGERS IV BOLUS (SEPSIS): 1000.0000 mL | Freq: Once | INTRAVENOUS | Status: DC

## 2020-03-06 MED ORDER — METFORMIN HCL ER 500 MG PO TB24
500.0000 mg | ORAL_TABLET | Freq: Two times a day (BID) | ORAL | Status: DC
  Administered 2020-03-06 – 2020-03-09 (×6): 500 mg via ORAL
  Filled 2020-03-06 (×8): qty 1

## 2020-03-06 MED ORDER — ACETAMINOPHEN 325 MG PO TABS: 650.0000 mg | ORAL_TABLET | ORAL | Status: DC | PRN

## 2020-03-06 MED ORDER — DIPHENHYDRAMINE HCL 50 MG/ML IJ SOLN: 12.5000 mg | INTRAMUSCULAR | Status: DC | PRN

## 2020-03-06 MED ORDER — LACTATED RINGERS IV BOLUS
500.0000 mL | Freq: Once | INTRAVENOUS | Status: AC
  Administered 2020-03-06: 500 mL via INTRAVENOUS

## 2020-03-06 MED ORDER — SODIUM CHLORIDE (PF) 0.9 % IJ SOLN
INTRAMUSCULAR | Status: DC | PRN
  Administered 2020-03-06: 12 mL/h via EPIDURAL

## 2020-03-06 MED ORDER — LIDOCAINE HCL (PF) 1 % IJ SOLN: 30.0000 mL | INTRAMUSCULAR | Status: DC | PRN

## 2020-03-06 MED ORDER — PIPERACILLIN-TAZOBACTAM 3.375 G IVPB 30 MIN: 3.3750 g | Freq: Three times a day (TID) | INTRAVENOUS | Status: DC

## 2020-03-06 MED ORDER — LIDOCAINE HCL (PF) 1 % IJ SOLN
INTRAMUSCULAR | Status: DC | PRN
  Administered 2020-03-06: 11 mL via EPIDURAL

## 2020-03-06 MED ORDER — DEXTROSE IN LACTATED RINGERS 5 % IV SOLN: INTRAVENOUS | Status: DC

## 2020-03-06 MED ORDER — DEXTROSE 50 % IV SOLN: 0.0000 mL | INTRAVENOUS | Status: DC | PRN

## 2020-03-06 MED ORDER — OXYCODONE-ACETAMINOPHEN 5-325 MG PO TABS: 1.0000 | ORAL_TABLET | ORAL | Status: DC | PRN

## 2020-03-06 MED ORDER — PIPERACILLIN-TAZOBACTAM 3.375 G IVPB
3.3750 g | Freq: Three times a day (TID) | INTRAVENOUS | Status: DC
  Administered 2020-03-06 – 2020-03-08 (×5): 3.375 g via INTRAVENOUS
  Filled 2020-03-06 (×7): qty 50

## 2020-03-06 MED ORDER — INSULIN DETEMIR 100 UNIT/ML ~~LOC~~ SOLN
35.0000 [IU] | Freq: Two times a day (BID) | SUBCUTANEOUS | Status: DC
  Administered 2020-03-06 – 2020-03-09 (×6): 35 [IU] via SUBCUTANEOUS
  Filled 2020-03-06 (×7): qty 0.35

## 2020-03-06 NOTE — Progress Notes (Signed)
Patient ID: Debra Roach, female   DOB: April 10, 1981, 39 y.o.   MRN: [id number] Pt reports urge to push On exam, completely dilated  Practice push noted dgood descent  Will plan on imminent delivery via svd  Cat 1, 120, occ variables

## 2020-03-06 NOTE — Anesthesia Preprocedure Evaluation (Signed)
Anesthesia Evaluation  Patient identified by MRN, date of birth, ID band Patient awake    Reviewed: Allergy & Precautions, NPO status , Patient's Chart, lab work & pertinent test results  Airway Mallampati: II  TM Distance: >3 FB Neck ROM: Full    Dental no notable dental hx.    Pulmonary neg pulmonary ROS, former smoker,    Pulmonary exam normal breath sounds clear to auscultation       Cardiovascular hypertension, Pt. on medications negative cardio ROS Normal cardiovascular exam Rhythm:Regular Rate:Normal     Neuro/Psych negative neurological ROS  negative psych ROS   GI/Hepatic negative GI ROS, Neg liver ROS,   Endo/Other  negative endocrine ROSdiabetes  Renal/GU negative Renal ROS  negative genitourinary   Musculoskeletal negative musculoskeletal ROS (+)   Abdominal   Peds negative pediatric ROS (+)  Hematology negative hematology ROS (+)   Anesthesia Other Findings   Reproductive/Obstetrics (+) Pregnancy                             Anesthesia Physical Anesthesia Plan  ASA: III  Anesthesia Plan: Epidural   Post-op Pain Management:    Induction:   PONV Risk Score and Plan:   Airway Management Planned:   Additional Equipment:   Intra-op Plan:   Post-operative Plan:   Informed Consent:   Plan Discussed with:   Anesthesia Plan Comments:         Anesthesia Quick Evaluation

## 2020-03-06 NOTE — Progress Notes (Signed)
Pt transferred to [facility] RM 117 at 2155

## 2020-03-06 NOTE — Progress Notes (Signed)
Inpatient Diabetes Program Recommendations  AACE/ADA: New Consensus Statement on Inpatient Glycemic Control (2015)  Target Ranges:  Prepandial:   less than 140 mg/dL      Peak postprandial:   less than 180 mg/dL (1-2 hours)      Critically ill patients:  140 - 180 mg/dL   Lab Results  Component Value Date   GLUCAP 111 (H) 03/06/2020   HGBA1C 6.8 (H) 03/06/2020    Review of Glycemic Control Results for AMOREE, NEWLON (MRN [id number]) as of 03/06/2020 10:26  Ref. Range 03/06/2020 06:47 03/06/2020 07:54 03/06/2020 08:56 03/06/2020 10:13  Glucose-Capillary Latest Ref Range: 70 - 99 mg/dL 79 585 (H) 84 929 (H)   Diabetes history: Type 2 DM Outpatient Diabetes medications: Levemir 75 units QD, 85 units QHS, Metformin 500 mg BID,  Novolog TID meal coverage: (70-100) 8 units, (100-125) 9 units, (126-150) 10 units, (+150) 10+1 units for every 25 above Current orders for Inpatient glycemic control: Levemir 85 units QHS, IV insulin  BMZ 12 mg X 2   Inpatient Diabetes Program Recommendations:    In preparation for transition following delivery, consider:  -Levemir 35 units two hours prior to discontinuation of IV insulin, then BID to follow -Novolog 0-15 units Q4H  - Restart Metformin 500 mg BID Following.   Thanks, Lujean Rave, MSN, RNC-OB Diabetes Coordinator [phone] (8a-5p)

## 2020-03-06 NOTE — Progress Notes (Addendum)
Patient ID: Debra Roach, female   DOB: 1981/02/12, 39 y.o.   MRN: [id number] Pt reports feeling better VS: 135/52,95  T- 98.9 GEN - NAD  Cr - 0.61 LDH - 2.7 13>11.4<251  NEUT - 10.7 Gluc 324  Plan - ok to transfer to [facility] specialty floor after CXR            Still awaiting zosyn from pharmacy to arrive so can start infusing            Consider lovenox if indicated

## 2020-03-06 NOTE — Progress Notes (Signed)
Patient comfortable, minimal cramping, denies VB  BP 132/73   Pulse 81   Temp 98.5 F (36.9 C) (Oral)   Resp 16   Ht 5\' 6"  (1.676 m)   Wt 89.9 kg   LMP  (Within Days)   SpO2 100%   BMI 31.99 kg/m  Ce 1-2/80/-1, Cat 1 tracing. 01-15-1984 irr  39yo Debra Roach @ 34 0/7 now admitted for IOL for PPROM.  -GBS neg on admission -S/p BMTZ and MgSO4 plus latency antibiotics -T2DM: PO metformin and SQ DC'ed, now on endotool, DM following -Continue to titrate pitocin per protocol

## 2020-03-06 NOTE — Progress Notes (Addendum)
Patient ID: Debra Roach, female   DOB: 28-Oct-1980, 39 y.o.   MRN: [id number] Pt now comfortable with epidural. No complaints. +Fms VSS EFM - cat 1, 120 TOCO - ctxs q 2-19mins SVE - 4cm within the hour  A/P: Progressing well in labor; pit still at         Will place FSE/ IUPC to better monitor contractions

## 2020-03-06 NOTE — Lactation Note (Signed)
This note was copied from a baby's chart. Lactation Consultation Note  Patient Name: Debra Roach [id number] Date: 03/06/2020 Reason for consult: Initial assessment;Late-preterm 34-36.6wks;NICU baby P3, 6 hour LPTI in NICU. Mom with hx: AMA, elevated BP,  DM with insulin and metformin, MgSo4 and  LC observed mom has flat nipples. Per mom, she briefly BF her 2nd child due to latch issue, she mostly pumped, mom pumped only for 2 months before returning to work. Mom understands to pump every 3 hours for 15 minutes on initial setting to help establish her milk supply mom fitted with 27 mm breast flange. Mom will pump and do hand expression afterwards, any breast milk expressed  dad will take to NICU and will  follow their infant  feeding policy and  guidelines. Mom knows to call RN or LC if she has any further questions or concerns regarding pumping or breastfeeding. Mom shown how to use DEBP & how to disassemble, clean, & reassemble parts. Mom made aware of O/P services, breastfeeding support groups, community resources, and our phone # for post-discharge questions.   Maternal Data Formula Feeding for Exclusion: No Has patient been taught Hand Expression?: Yes Does the patient have breastfeeding experience prior to this delivery?: Yes  Feeding    LATCH Score                   Interventions Interventions: Breast feeding basics reviewed;Breast massage;Hand express;Expressed milk;DEBP  Lactation Tools Discussed/Used WIC Program: No Pump Review: Setup, frequency, and cleaning;Milk Storage Initiated by:: Danelle Earthly, IBCLC Date initiated:: 03/06/20   Consult Status Consult Status: Follow-up Date: 03/07/20 Follow-up type: In-patient    Danelle Earthly 03/06/2020, 11:20 PM

## 2020-03-06 NOTE — Progress Notes (Signed)
Patient ID: Debra Roach, female   DOB: 1981-01-08, 39 y.o.   MRN: [id number] Late entry  I was called and notified of pt Temp>100.4, tachycardic in 120s while recovering from delivery on L/D  I advised giving 1000mg  tylenol, a LR fluid bolus and starting on unasyn.   About an hour later I was called with an update. Despite above, pt still with fever >100.4. Tachycardia improved but still in 100-110s I ordered a cbc, cmp, LDH and proceeded to head to the hospital  On arrival, I found pt to be in no apparent acute distress but understandably scared and worried. She felt warm to touch. She denied dyuria, abdominal pain, leg pain, chest pain or cough. She did admit to a sore throat and to having had breast engorgement for a few days - had to pump to decrease engorgement earlier.   GEN - anxious, NAD SKIN - warm to touch, dry BREASTS- no localized areas of discomfort, mild engorgement  ABD - soft, non distended, non-tender, no guarding or rebound GU - urine catheterization in process EXT - no homans or edema  I explained to pt and husband about common entry points and sources of bacterial infections. I explained possible testing to narrow down source and hence diagnosis.  I reviewed the sepsis protocol and also spoke to [facility] and switched antibiotic from unasyn to zosyn.

## 2020-03-06 NOTE — Progress Notes (Signed)
Patient ID: Debra Roach, female   DOB: 05/13/1981, 39 y.o.   MRN: [id number] Pt reports not appreciating any contractions. +Fms. No fever  VSS EFM - cat 1, 140 TOCO - no contractions SVE - 1-2cm an hour ago per exam by Dr. Tiburcio Pea -  A/P: 94MH [id number] at 15 0/7wks with PPROM, T2DM; s/p BMZ          - Continue pitocin induction and current medical regimen          - Plan for vaginal delivery

## 2020-03-06 NOTE — Anesthesia Procedure Notes (Signed)
Epidural Patient location during procedure: OB Start time: 03/06/2020 11:04 AM End time: 03/06/2020 11:20 AM  Staffing Anesthesiologist: Lowella Curb, MD Performed: anesthesiologist   Preanesthetic Checklist Completed: patient identified, IV checked, site marked, risks and benefits discussed, surgical consent, monitors and equipment checked, pre-op evaluation and timeout performed  Epidural Patient position: sitting Prep: ChloraPrep Patient monitoring: heart rate, cardiac monitor, continuous pulse ox and blood pressure Approach: midline Location: L2-L3 Injection technique: LOR saline  Needle:  Needle type: Tuohy  Needle gauge: 17 G Needle length: 9 cm Needle insertion depth: 6 cm Catheter type: closed end flexible Catheter size: 20 Guage Catheter at skin depth: 10 cm Test dose: negative  Assessment Events: blood not aspirated, injection not painful, no injection resistance, no paresthesia and negative IV test  Additional Notes Reason for block:procedure for pain

## 2020-03-06 NOTE — Progress Notes (Signed)
SEPSIS SIDEBAR  Does your patient meet 2 or more of the SIRS criteria? . RR >20 . HR >90 . Temp >100.9 or <96.8 (treat fever per orders if identified) . WBC <4 or >12 Does the patient have a confirmed OR suspected infection? . Possible infectious etiology (ex: wound, PNA, cellulitis, UTI) . High risk populations: o Dialysis/ Existing invasive/ Indwelling tubes o Resident of nursing home/LTAC o Recent hospitalization/ surgery o Immune compromised/suppressed    1. Baseline assessment, Confirm patients HT/WT, check MEWS score 2. Notify the provider via Amion (if 2 SIRS criteria + possible infection is present) put the wording "possible sepsis" in notification. 3. If provider states this is a Code Sepsis or Sepsis order set is initiated: a. Automation of Carelink notifications/eLink monitoring b. If issues with Carelink call [phone] c. Call Charge RN and Rapid Response RN (IP only) for assistance d. Lab notification (Collection of Lactic Acid, Blood cultures, PCT)  e. Pharmacy to respond (Unit based vs Page Pharmacist)  Code Sepsis Activation:  Hours 0-1 1. Establish 2 IVs (notify resources if needed) 2. If ordered by provider a. Collect urine for UA (I/O Cath only)  3. Notify MD for SBP <90, MAP <65, and/or Lactate ? 4 - Anticipate 32ml/kg of IV fluid a. IBW (Ideal Body Weight) can be used if BMI >/= 35 4. Start antibiotics AFTER blood cultures have been collected  If unable to infuse both antibiotics because of compatibility hang broad spectrum first: Common Broad Spectrum Abx: Cefepime, Rocephin, Zosyn,   REMINDER: VANCOMYCIN IS A NARROW SPECTRUM ABX     1 Hour Reassessment Possible Severe Sepsis/ Severe Sepsis with Organ Dysfunction Does the patient have one of the following signs of organ dysfunction?  . SBP < 90 despite fluid resuscitation . MAP < 65 despite fluid resuscitation . Worsening respiratory status  . Decreased LOC . Lactate ? 2 . Creatinine / Total  bilirubin > 2 . Platelets < 100,000  Note the above assessment is your 1-hour REASSESSMENT. Call Provider with findings. . If above findings are positive, consider higher level of care . If above findings are negative, consider cancelling code sepsis   Code Sepsis Activation: Hours 1-3 1. Repeat Lactic Acid within a 3-hour window if initial was greater than 2 a. Continue to draw If Lactic Acid is trending up 2. Ensure that Provider is aware of fluid bolus completion 3. Consider maintenance fluids following bolus 4. If SBP <90, MAP <65, OR lactate ? 4 after 57ml/kg bolus. a. Expect CCM consult b. Vital signs Q15 minutes x 8 (T, HR, R, B/P), q30 mins x 4, q2 hours x 2, Q4 hours x 6   Code Sepsis Activation: Hours 3 and on 1. Notify MD of significant changes: a. Monitor for persistent hypotension/ Anticipate administration of Vasopressors  b. Change in respiratory effort/rate c. Fever d. Persistent/increasing lactic acid levels e. Decrease LOC 2. RN to remind provider to document Repeat Assessment     Sepsis committee 09/07/2018

## 2020-03-07 LAB — CBC
HCT: 30.3 % — ABNORMAL LOW (ref 36.0–46.0)
Hemoglobin: 9.8 g/dL — ABNORMAL LOW (ref 12.0–15.0)
MCH: 27.1 pg (ref 26.0–34.0)
MCHC: 32.3 g/dL (ref 30.0–36.0)
MCV: 83.7 fL (ref 80.0–100.0)
Platelets: 251 10*3/uL (ref 150–400)
RBC: 3.62 MIL/uL — ABNORMAL LOW (ref 3.87–5.11)
RDW: 14.5 % (ref 11.5–15.5)
WBC: 10.6 10*3/uL — ABNORMAL HIGH (ref 4.0–10.5)
nRBC: 0 % (ref 0.0–0.2)

## 2020-03-07 LAB — GLUCOSE, CAPILLARY
Glucose-Capillary: 133 mg/dL — ABNORMAL HIGH (ref 70–99)
Glucose-Capillary: 127 mg/dL — ABNORMAL HIGH (ref 70–99)
Glucose-Capillary: 100 mg/dL — ABNORMAL HIGH (ref 70–99)
Glucose-Capillary: 76 mg/dL (ref 70–99)
Glucose-Capillary: 181 mg/dL — ABNORMAL HIGH (ref 70–99)
Glucose-Capillary: 149 mg/dL — ABNORMAL HIGH (ref 70–99)
Glucose-Capillary: 166 mg/dL — ABNORMAL HIGH (ref 70–99)

## 2020-03-07 LAB — URINE CULTURE: Culture: NO GROWTH

## 2020-03-07 LAB — LACTIC ACID, PLASMA: Lactic Acid, Venous: 1.9 mmol/L (ref 0.5–1.9)

## 2020-03-07 MED ORDER — BENZOCAINE-MENTHOL 20-0.5 % EX AERO
1.0000 "application " | INHALATION_SPRAY | CUTANEOUS | Status: DC | PRN
  Administered 2020-03-07: 1 via TOPICAL
  Filled 2020-03-07: qty 56

## 2020-03-07 MED ORDER — ACETAMINOPHEN 325 MG PO TABS: 650.0000 mg | ORAL_TABLET | ORAL | Status: DC | PRN

## 2020-03-07 MED ORDER — ONDANSETRON HCL 4 MG/2ML IJ SOLN: 4.0000 mg | INTRAMUSCULAR | Status: DC | PRN

## 2020-03-07 MED ORDER — INSULIN ASPART 100 UNIT/ML ~~LOC~~ SOLN
3.0000 [IU] | Freq: Three times a day (TID) | SUBCUTANEOUS | Status: DC
  Administered 2020-03-07 – 2020-03-09 (×5): 3 [IU] via SUBCUTANEOUS

## 2020-03-07 MED ORDER — PRENATAL MULTIVITAMIN CH
1.0000 | ORAL_TABLET | Freq: Every day | ORAL | Status: DC
  Administered 2020-03-07 – 2020-03-09 (×3): 1 via ORAL
  Filled 2020-03-07 (×3): qty 1

## 2020-03-07 MED ORDER — ONDANSETRON HCL 4 MG PO TABS: 4.0000 mg | ORAL_TABLET | ORAL | Status: DC | PRN

## 2020-03-07 MED ORDER — SENNOSIDES-DOCUSATE SODIUM 8.6-50 MG PO TABS
2.0000 | ORAL_TABLET | ORAL | Status: DC
  Administered 2020-03-07 – 2020-03-09 (×3): 2 via ORAL
  Filled 2020-03-07 (×3): qty 2

## 2020-03-07 MED ORDER — SIMETHICONE 80 MG PO CHEW: 80.0000 mg | CHEWABLE_TABLET | ORAL | Status: DC | PRN

## 2020-03-07 MED ORDER — WITCH HAZEL-GLYCERIN EX PADS: 1.0000 "application " | MEDICATED_PAD | CUTANEOUS | Status: DC | PRN

## 2020-03-07 MED ORDER — DIBUCAINE (PERIANAL) 1 % EX OINT: 1.0000 "application " | TOPICAL_OINTMENT | CUTANEOUS | Status: DC | PRN

## 2020-03-07 MED ORDER — OXYCODONE HCL 5 MG PO TABS: 10.0000 mg | ORAL_TABLET | ORAL | Status: DC | PRN

## 2020-03-07 MED ORDER — TETANUS-DIPHTH-ACELL PERTUSSIS 5-2.5-18.5 LF-MCG/0.5 IM SUSP: 0.5000 mL | Freq: Once | INTRAMUSCULAR | Status: DC

## 2020-03-07 MED ORDER — OXYCODONE HCL 5 MG PO TABS: 5.0000 mg | ORAL_TABLET | ORAL | Status: DC | PRN

## 2020-03-07 MED ORDER — ZOLPIDEM TARTRATE 5 MG PO TABS: 5.0000 mg | ORAL_TABLET | Freq: Every evening | ORAL | Status: DC | PRN

## 2020-03-07 MED ORDER — COCONUT OIL OIL
1.0000 "application " | TOPICAL_OIL | Status: DC | PRN
  Administered 2020-03-07: 1 via TOPICAL

## 2020-03-07 MED ORDER — DEXTROSE IN LACTATED RINGERS 5 % IV SOLN: INTRAVENOUS | Status: DC

## 2020-03-07 MED ORDER — IBUPROFEN 600 MG PO TABS
600.0000 mg | ORAL_TABLET | Freq: Four times a day (QID) | ORAL | Status: DC
  Administered 2020-03-07 – 2020-03-09 (×9): 600 mg via ORAL
  Filled 2020-03-07 (×9): qty 1

## 2020-03-07 MED ORDER — DIPHENHYDRAMINE HCL 25 MG PO CAPS: 25.0000 mg | ORAL_CAPSULE | Freq: Four times a day (QID) | ORAL | Status: DC | PRN

## 2020-03-07 NOTE — Progress Notes (Addendum)
Patient ID: Debra Roach, female   DOB: 1981-07-03, 39 y.o.   MRN: [id number] Chart check  Zosyn finally hung at 10pm Pt afebrile ( T98; Tmax 101.4 at 1815 on 8/31; last fever 100.9 on 8/31 at 1905) , BP 129/56; HR 78, Resp 18  CBG 140 at 2350 on 8/31   2D CXR - No disease  Urine and blood cultures pending Sputum culture?   Plan : continue on zosyn till afebrile at least 24hrs            Review blood and urine culture results when available; consider sputum culture if not done by this am           Continue on endotool with parameters in place           Differential dx includes:  postpartum endometritis, postpartum mastitis, UTI, URI, septic pelvic thrombophlebitis.

## 2020-03-07 NOTE — Lactation Note (Signed)
This note was copied from a baby's chart. Lactation Consultation Note  Patient Name: Girl Debra Roach'P Date: 03/07/2020 Reason for consult: Other (Comment);Follow-up assessment;NICU baby;Late-preterm 34-36.6wks;Maternal endocrine disorder;Infant < 6lbs (AMA) Type of Endocrine Disorder?: Diabetes (DM type 2)  Visited with mom of a 15 hours old LPI NICU female < 5 lbs, she's a P3. Mom has been pumping every 3 hours and getting small drops of colostrum that she's been saving on the colostrum containers, praised her for her efforts.  She told LC that she was getting sore on her left breast, but her RN has already brought some coconut oil and she reported improvement; mom currently using flanges # 27. Instructed mom to apply coconut oil prior pumping. Reviewed breastmilk storage guidelines, lactogenesis II, pumping schedule and prevention/treatment of sore nipples.  Feeding plan:  1. Encouraged mom to keep pumping every 3 hours; at least 8 pumping sessions/24 hours 2. Breast massage and hand expression were also encouraged prior pumping 3. She'll start labeling her colostrum containers to take them to the NICU  No support person in the room at the time of [facility] consultation. Mom reported all questions and concerns were answered, she's aware of LC OP services and will call PRN.   Maternal Data    Feeding    LATCH Score                   Interventions Interventions: Breast feeding basics reviewed;Coconut oil;DEBP  Lactation Tools Discussed/Used Tools: Coconut oil;Pump Breast pump type: Double-Electric Breast Pump   Consult Status Consult Status: Follow-up Date: 03/08/20 Follow-up type: In-patient    Debra Roach Constable 03/07/2020, 8:29 AM

## 2020-03-07 NOTE — Progress Notes (Signed)
@  3 a.m. Control criteria met per Endotool. Md notified and order given to d/c endotool and continue with Levemir 35 units BID  and metformin 500 MG BID.Will continue to monitor pt closely.

## 2020-03-07 NOTE — Anesthesia Postprocedure Evaluation (Signed)
Anesthesia Post Note  Patient: Debra Roach  Procedure(s) Performed: AN AD HOC LABOR EPIDURAL     Patient location during evaluation: OB High Risk Anesthesia Type: Epidural Level of consciousness: awake and alert Pain management: pain level controlled Vital Signs Assessment: post-procedure vital signs reviewed and stable Respiratory status: spontaneous breathing, nonlabored ventilation and respiratory function stable Cardiovascular status: stable Postop Assessment: no headache, no backache and epidural receding Anesthetic complications: no   No complications documented.  Last Vitals:  Vitals:   03/07/20 0205 03/07/20 0326  BP: (!) 125/58 140/75  Pulse: 67 69  Resp: 20 19  Temp: 36.7 C 36.5 C  SpO2: 97% 98%    Last Pain:  Vitals:   03/07/20 0326  TempSrc: Oral  PainSc:    Pain Goal: Patients Stated Pain Goal: 6 (03/06/20 1030)                 Tirrell Buchberger

## 2020-03-07 NOTE — Progress Notes (Signed)
PPD #1 No problems, feeling much better Afeb, VSS Fundus firm, NT at U-1 CBG normal, Hgb 9.8, WBC 10.6 Continue routine postpartum care, will keep on Zosyn until tomorrow morning at least.  I suspect her fever/symptoms were from PPROM since rapid resolution, on insulin and Metformin for DM per diabetes coordinator note

## 2020-03-07 NOTE — Progress Notes (Signed)
Patient screened out for psychosocial assessment since none of the following apply: °Psychosocial stressors documented in mother or baby's chart °Gestation less than 32 weeks °Code at delivery  °Infant with anomalies °Please contact the Clinical Social Worker if specific needs arise, by MOB's request, or if MOB scores greater than 9/yes to question 10 on Edinburgh Postpartum Depression Screen. ° °Sammy Cassar Boyd-Gilyard, MSW, LCSW °Clinical Social Work °[phone] °  °

## 2020-03-07 NOTE — Progress Notes (Signed)
Inpatient Diabetes Program Recommendations  AACE/ADA: New Consensus Statement on Inpatient Glycemic Control (2015)  Target Ranges:  Prepandial:   less than 140 mg/dL      Peak postprandial:   less than 180 mg/dL (1-2 hours)      Critically ill patients:  140 - 180 mg/dL   Lab Results  Component Value Date   GLUCAP 181 (H) 03/07/2020   HGBA1C 6.8 (H) 03/06/2020    Review of Glycemic Control Results for Debra Roach, Debra Roach (MRN [id number]) as of 03/07/2020 10:53  Ref. Range 03/07/2020 03:00 03/07/2020 06:20 03/07/2020 09:29  Glucose-Capillary Latest Ref Range: 70 - 99 mg/dL 191 (H) 76 478 (H)   Diabetes history: Type 2 DM Outpatient Diabetes medications:Levemir 75 units QD, 85 units QHS, Metformin 500 mg BID,  Novolog TID meal coverage: (70-100) 8 units, (100-125) 9 units, (126-150) 10 units, (+150) 10+1 units for every 25 above Current orders for Inpatient glycemic control: Novolog 1-15 units Q4H, Levemir 35 units BID, Metformin 500 mg BID  Inpatient Diabetes Program Recommendations:    Now that patient is eating, consider:  -changing correction to Novolog 0-9 units TID & Hs -Adding meal coverage- Novolog 3 units TID (assuming patient is consuming >50% of meal).   Thanks, Lujean Rave, MSN, RNC-OB Diabetes Coordinator [phone] (8a-5p)

## 2020-03-08 LAB — GLUCOSE, CAPILLARY
Glucose-Capillary: 124 mg/dL — ABNORMAL HIGH (ref 70–99)
Glucose-Capillary: 75 mg/dL (ref 70–99)
Glucose-Capillary: 94 mg/dL (ref 70–99)
Glucose-Capillary: 83 mg/dL (ref 70–99)
Glucose-Capillary: 110 mg/dL — ABNORMAL HIGH (ref 70–99)

## 2020-03-08 LAB — SURGICAL PATHOLOGY

## 2020-03-08 NOTE — Lactation Note (Signed)
This note was copied from a baby's chart. Lactation Consultation Note  Patient Name: Debra Roach Date: 03/08/2020 Reason for consult: Follow-up assessment  LC Follow Up Visit:  Attempted to visit with mother, however, she is not in her room at this time.  Plans are to visit later today or possibly visit with her in the NICU.   Maternal Data    Feeding Feeding Type: Donor Breast Milk  LATCH Score                   Interventions    Lactation Tools Discussed/Used     Consult Status Consult Status: Follow-up Date: 03/09/20 Follow-up type: In-patient    Meriam Chojnowski R Zebediah Beezley 03/08/2020, 10:56 AM

## 2020-03-08 NOTE — Progress Notes (Signed)
Post Partum Day 2 Subjective: no complaints, up ad lib, voiding, tolerating PO and nl lochia, pain controlled  Objective: Blood pressure 133/65, pulse 80, temperature 97.8 F (36.6 C), temperature source Oral, resp. rate 18, height 5\' 6"  (1.676 m), weight 89.9 kg, SpO2 98 %, unknown if currently breastfeeding.  Physical Exam:  General: alert and no distress Lochia: appropriate Uterine Fundus: firm   Recent Labs    03/06/20 1933 03/07/20 0532  HGB 11.4* 9.8*  HCT 34.9* 30.3*    Assessment/Plan: Plan for discharge tomorrow  D/C Zosyn this AM - AF x >24hr,  Close monitoring Baby stable in NICU   LOS: 7 days   Kilani Joffe Bovard-Stuckert 03/08/2020, 8:44 AM

## 2020-03-08 NOTE — Lactation Note (Signed)
This note was copied from a baby's chart. Lactation Consultation Note  Patient Name: Debra Roach OHYWV'P Date: 03/08/2020 Reason for consult: Follow-up assessment;NICU baby;Infant < 6lbs Type of Endocrine Disorder?: Diabetes  P3 mother whose infant is now 48 hours old.  This is a LPTI at 34+0 weeks with a CGA of 34+2 weeks.  Mother attempted to breast feed her second child, however, she ended up pumping and bottle feeding for approximately 2 months before returning to work.  Visited with mother in NICU; baby was asleep in mother's arms and tube feeding was being infused.  Mother had no questions/concerns related to pumping.  She has been pumping approximately every three hours and has started obtaining drops of colostrum.  Discussed collection of EBM and transporting the drops to the NICU.  Encouraged mother to continue doing breast massage and hand expression before/after pumping.  Mother stated she has warm packs at home that she plans to use prior to hand expression.    Suggested mother use EBM on nipples after pumping for comfort.  She currently has coconut oil at bedside and will continue using this.    Mother has a [employer] breast pump for home use.  She is a [facility] participant and I offered to send a referral so she may obtain a Medela DEBP as desired.  Mother is very interested in doing this stating she likes the Medela pump.  Referral faxed.  Mother will follow up with a phone call to [facility] tomorrow morning to discuss obtaining a DEBP.  Mother will call for any further questions/concerns.   Maternal Data Has patient been taught Hand Expression?: Yes  Feeding Feeding Type: Donor Breast Milk  LATCH Score                   Interventions    Lactation Tools Discussed/Used WIC Program: Yes   Consult Status Consult Status: Follow-up Date: 03/09/20 Follow-up type: In-patient    Debra Roach 03/08/2020, 12:40 PM

## 2020-03-08 NOTE — Progress Notes (Signed)
CSW received and acknowledges consult for EDPS of 8.  Consult screened out due to  on EDPS 8 does not warrant a CSW consult.  MOB whom scores are greater than 9/yes to question 10 on Edinburgh Postpartum Depression Screen warrants a CSW consult.   Billye Pickerel, LCSW Clinical Social Worker [facility] Cell#: [phone] 

## 2020-03-09 LAB — GLUCOSE, CAPILLARY: Glucose-Capillary: 91 mg/dL (ref 70–99)

## 2020-03-09 MED ORDER — ACETAMINOPHEN 325 MG PO TABS: 650.0000 mg | ORAL_TABLET | ORAL | 1 refills | Status: AC | PRN

## 2020-03-09 MED ORDER — INSULIN ASPART 100 UNIT/ML ~~LOC~~ SOLN: 3.0000 [IU] | Freq: Three times a day (TID) | SUBCUTANEOUS | 11 refills | Status: AC

## 2020-03-09 MED ORDER — IBUPROFEN 600 MG PO TABS: 600.0000 mg | ORAL_TABLET | Freq: Four times a day (QID) | ORAL | 0 refills | Status: AC

## 2020-03-09 MED ORDER — INSULIN DETEMIR 100 UNIT/ML ~~LOC~~ SOLN: 35.0000 [IU] | Freq: Two times a day (BID) | SUBCUTANEOUS | 11 refills | Status: AC

## 2020-03-09 MED ORDER — METFORMIN HCL ER 500 MG PO TB24: 500.0000 mg | ORAL_TABLET | Freq: Two times a day (BID) | ORAL | 1 refills | Status: AC

## 2020-03-09 NOTE — Lactation Note (Signed)
This note was copied from a baby's chart. Lactation Consultation Note  Patient Name: Debra Roach [id number]'D Date: 03/09/2020 Reason for consult: Follow-up assessment;NICU baby;Infant < 6lbs;Late-preterm 34-36.6wks;Maternal endocrine disorder Type of Endocrine Disorder?: Diabetes  LC in to visit with P3 Mom of LPTI in NICU. Baby 64 hrs old.  Mom to be discharged today.  WIC referral resent for DEBP for home use.  Mom states she has a hand pump.  Mom aware of DEBP in baby's room that she can use.  Talked about benefits of pumping at baby's bedside.   Encouraged STS as much as possible. Mom states she has  Nuzzled baby at the breast and she latched onto nipple and sucked a few times.    Mom double pumping regularly.  Mom asked if she had to wait 3 hrs before she pumped again.  Talked about pumping every 11/2 to 3 hrs during the day and 3-4 hrs at night.  Encouraged warm massage prior to pumping, hand expressing benefits reviewed.    Engorgement prevention and treatment reviewed.  Mom states she would go by [facility] office to obtain her pump after being discharged.  Mom instructed to take her pump parts with her to use on [facility] pump and Symphony pump in baby's room.  Encouraged Mom to ask for Lactation when in room with baby prn.  Interventions Interventions: Breast feeding basics reviewed;Skin to skin;Breast massage;Hand express;DEBP;Expressed milk  Lactation Tools Discussed/Used Tools: Pump;Coconut oil Breast pump type: Double-Electric Breast Pump   Consult Status Consult Status: Follow-up Date: 03/10/20 Follow-up type: In-patient    Judee Clara 03/09/2020, 9:14 AM

## 2020-03-09 NOTE — Progress Notes (Signed)
Post Partum Day 3  PPROM, pp chorioamnionitis, Type 2 DM   Subjective: no complaints, up ad lib and tolerating PO.  Pt pumping and getting some transitional milk.  No fever, no chills.  Feels good.  Baby stable in NICU and just working on feeds now.   Objective: Blood pressure 134/81, pulse 74, temperature 98.4 F (36.9 C), temperature source Oral, resp. rate 16, height 5\' 6"  (1.676 m), weight 89.9 kg, SpO2 100 %, unknown if currently breastfeeding.  Physical Exam:  General: alert and cooperative Lochia: appropriate Uterine Fundus: firm   Recent Labs    03/06/20 1933 03/07/20 0532  HGB 11.4* 9.8*  HCT 34.9* 30.3*   FBS 91  Other levels controlled 83-124  Assessment/Plan: Discharge home  Stable off antibiotics > 24 hours with no temp. BS well-controlled on current regimen of levimir 35 units BID, meformin and meal coverage with novolog.  States her DM was uncontrolled prior to pregnancy but she is committed to keeping it better now.  Will call Dr. 05/07/20 for f/u appt in next 1-2 weeks Considering Mirena    LOS: 8 days   Talmage Nap 03/09/2020, 9:37 AM

## 2020-03-09 NOTE — Discharge Summary (Signed)
Postpartum Discharge Summary       Patient Name: Debra Roach DOB: 03-11-1981 MRN: [id number]  Date of admission: 03/01/2020 Delivery date:03/06/2020  Delivering provider: Pryor Ochoa [facility]  Date of discharge: 03/09/2020  Admitting diagnosis: Preterm premature rupture of membranes (PPROM) with unknown onset of labor [O42.919] Intrauterine pregnancy: [redacted]w[redacted]d     Secondary diagnosis:  Active Problems:   Preterm premature rupture of membranes (PPROM) with unknown onset of labor  Additional problems: postpartum chorioamnionitis, Type 2 DM   Discharge diagnosis: Preterm Pregnancy Delivered                                              Post partum procedures:antibiotics Augmentation: Pitocin Complications: None  Hospital course: Induction of Labor With Vaginal Delivery   39 y.o. yo [phone] at [redacted]w[redacted]d was admitted to the hospital 03/01/2020 for induction of labor.  Indication for induction: PPROM.  Patient had an uncomplicated labor course as follows: Membrane Rupture Time/Date: 11:30 AM ,03/01/2020   Delivery Method:Vaginal, Spontaneous  Episiotomy: None  Lacerations:  None  Details of delivery can be found in separate delivery note.  Patient had a routine postpartum course. Patient is discharged home 03/09/20.  Newborn Data: Birth date:03/06/2020  Birth time:4:40 PM  Gender:Female  Living status:Living  Apgars:8 ,8  Weight:2210 g   Magnesium Sulfate received: Yes: Neuroprotection BMZ received: Yes Rhophylac:No Postpartum Zosyn received  Physical exam  Vitals:   03/08/20 1859 03/08/20 2235 03/09/20 0413 03/09/20 0817  BP: (!) 143/68 (!) 145/70 136/67 134/81  Pulse: 80 69 68 74  Resp: 18 17 15 16   Temp: 98.2 F (36.8 C) 97.8 F (36.6 C) 98.4 F (36.9 C)   TempSrc: Oral Oral Oral   SpO2: 100% 99% 100%   Weight:      Height:       General: alert and cooperative Lochia: appropriate Uterine Fundus: firm  Labs: Lab Results  Component Value Date   WBC 10.6 (H)  03/07/2020   HGB 9.8 (L) 03/07/2020   HCT 30.3 (L) 03/07/2020   MCV 83.7 03/07/2020   PLT 251 03/07/2020   CMP Latest Ref Rng & Units 03/06/2020  Glucose 70 - 99 mg/dL 03/08/2020)  BUN 6 - 20 mg/dL 7  Creatinine 867(Y - 1.95 mg/dL 0.93  Sodium 2.67 - 124 mmol/L 134(L)  Potassium 3.5 - 5.1 mmol/L 3.6  Chloride 98 - 111 mmol/L 104  CO2 22 - 32 mmol/L 19(L)  Calcium 8.9 - 10.3 mg/dL 8.9  Total Protein 6.5 - 8.1 g/dL 580)  Total Bilirubin 0.3 - 1.2 mg/dL 0.3  Alkaline Phos 38 - 126 U/L 72  AST 15 - 41 U/L 14(L)  ALT 0 - 44 U/L 12   Edinburgh Score: Edinburgh Postnatal Depression Scale Screening Tool 03/08/2020  I have been able to laugh and see the funny side of things. 0  I have looked forward with enjoyment to things. 0  I have blamed myself unnecessarily when things went wrong. 3  I have been anxious or worried for no good reason. 1  I have felt scared or panicky for no good reason. 0  Things have been getting on top of me. 2  I have been so unhappy that I have had difficulty sleeping. 0  I have felt sad or miserable. 1  I have been so unhappy that I have been crying. 1  The thought of harming myself has occurred to me. 0  Edinburgh Postnatal Depression Scale Total 8     After visit meds:  Allergies as of 03/09/2020   No Known Allergies     Medication List    STOP taking these medications   NovoLOG FlexPen 100 UNIT/ML FlexPen Generic drug: insulin aspart Replaced by: insulin aspart 100 UNIT/ML injection   progesterone 200 MG capsule Commonly known as: PROMETRIUM     TAKE these medications   acetaminophen 325 MG tablet Commonly known as: Tylenol Take 2 tablets (650 mg total) by mouth every 4 (four) hours as needed (for pain scale < 4).   ibuprofen 600 MG tablet Commonly known as: ADVIL Take 1 tablet (600 mg total) by mouth every 6 (six) hours.   insulin aspart 100 UNIT/ML injection Commonly known as: novoLOG Inject 3 Units into the skin 3 (three) times daily with  meals. Replaces: NovoLOG FlexPen 100 UNIT/ML FlexPen   insulin detemir 100 UNIT/ML injection Commonly known as: LEVEMIR Inject 0.35 mLs (35 Units total) into the skin 2 (two) times daily. What changed:   how much to take  when to take this  additional instructions   metFORMIN 500 MG 24 hr tablet Commonly known as: GLUCOPHAGE-XR Take 1 tablet (500 mg total) by mouth 2 (two) times daily with a meal. What changed: when to take this   prenatal multivitamin Tabs tablet Take 1 tablet by mouth daily at 12 noon.        Discharge home in stable condition Infant Feeding: Breast Infant Disposition:NICU Discharge instruction: per After Visit Summary and Postpartum booklet. Activity: Advance as tolerated. Pelvic rest for 6 weeks.  Diet: carb modified diet Future Appointments:No future appointments. Follow up Visit:  Follow-up Information    Edwinna Areola, DO. Schedule an appointment as soon as possible for a visit in 6 week(s).   Specialty: Obstetrics and Gynecology Why: postpartum visit Contact information: [address] [place] [address] [place] [address] [phone]                Please schedule this patient for a In person postpartum visit in 4 weeks with the following provider: MD.  Delivery mode:  Vaginal, Spontaneous  Anticipated Birth Control:  Unsure   03/09/2020 Oliver Pila, MD

## 2020-03-10 ENCOUNTER — Ambulatory Visit: Payer: Self-pay

## 2020-03-10 NOTE — Lactation Note (Signed)
This note was copied from a baby's chart. Lactation Consultation Note  Patient Name: Debra Roach ZYSAY'T Date: 03/10/2020 Reason for consult: Follow-up assessment;NICU baby;Infant < 6lbs;Late-preterm 34-36.6wks GDM  RN requested assistance with baby breastfeeding. P3 Mom of 4 day old baby born at 49 wks.  Baby 4 lbs 7.4 oz today.  Mom has been double pumping  Using the [employer] Symphony pump in baby's room.  Mom wasn't able to go by [facility] yesterday and is using a single breast pump at home.  She is able to express 2 oz each pumping.   Offered a [facility] loaner from hospital (10 days).  Paperwork done and Mom instructed on return.  Baby placed STS in football and cradle hold.  Baby opens his mouth and gives a couple sucks.  Mom taught to hand express to entice baby.  Baby opens his mouth but doesn't latch and suck rhythmically.  Reassured Mom and FOB that baby was acting exactly age appropriate for a 101w4d baby.    Encouraged as much STS as possible with baby prone tummy to tummy.  Encouraged continued pumping both breasts, every 2-3 hrs during the day and 3-4 hrs at night, adding hand expression also.    Feeding Feeding Type: Breast Milk  LATCH Score Latch: Repeated attempts needed to sustain latch, nipple held in mouth throughout feeding, stimulation needed to elicit sucking reflex.  Audible Swallowing: None  Type of Nipple: Everted at rest and after stimulation  Comfort (Breast/Nipple): Soft / non-tender  Hold (Positioning): Full assist, staff holds infant at breast  LATCH Score: 5  Interventions Interventions: Breast feeding basics reviewed;Assisted with latch;Skin to skin;Breast massage;Hand express;DEBP;Support pillows;Position options  Lactation Tools Discussed/Used Tools: Pump Breast pump type: Double-Electric Breast Pump   Consult Status Consult Status: Follow-up Date: 03/12/20 Follow-up type: In-patient    Judee Clara 03/10/2020, 4:40 PM

## 2020-03-11 LAB — CULTURE, BLOOD (ROUTINE X 2)
Culture: NO GROWTH
Culture: NO GROWTH
Special Requests: ADEQUATE
Special Requests: ADEQUATE

## 2020-03-12 ENCOUNTER — Ambulatory Visit: Payer: Self-pay

## 2020-03-12 NOTE — Lactation Note (Signed)
This note was copied from a baby's chart. Lactation Consultation Note  Patient Name: Debra Roach ESPQZ'R Date: 03/12/2020 Reason for consult: Follow-up assessment;Difficult latch;NICU baby;Late-preterm 34-36.6wks  1355 - 1417 - I followed up with Ms. Wieting to assist with her feeding at 1400. Baby Debra Roach is now 19 days old and 34 weeks 6 days. Debra Roach was sleeping in Ms. Palla's arms upon entry.  We attempted to breast feed her in cross cradle hold. We gently woke baby up and offered the right breast. I directed Ms. Ruff to express some milk on this side, and baby licked it off. She latched briefly with closed, tight latch, and then she let go. We were unable to re-latch baby.  I invited baby to suckle on a gloved finger, and she did so with some rhythmic suckling sequences. We placed a size 20 nipple shield on Ms. Yeo's left breast and again tried to latch baby, this time in football hold. Baby opened her mouth around the shield but would not suckle at the breast. We discontinued the feeding.  Ms. Sobiech is aware of how baby's developmental readiness factors in to her energy and stamina at the breast. I encouraged continued opportunities for baby to lick and learn and be near the breast at feeding times.   Ms. Crutcher is pumping every 3 hours. She tries to be present for the 11 am and 1400 feeding intervals. She is willing to come in at alternate times if baby is more alert at those feeding times. I recommended that lactation could follow up again later this week, and she agreed. I also recommended that she continue to pump 8 times a day; she is using the maintenance setting on her DEBP.  Maternal Data Has patient been taught Hand Expression?: Yes Does the patient have breastfeeding experience prior to this delivery?: Yes  Feeding Feeding Type: Breast Milk (breast feed attempt with LC)  LATCH Score Latch: Too sleepy or reluctant, no latch achieved, no sucking elicited.  Audible  Swallowing: None  Type of Nipple: Everted at rest and after stimulation  Comfort (Breast/Nipple): Soft / non-tender  Hold (Positioning): Assistance needed to correctly position infant at breast and maintain latch.  LATCH Score: 5  Interventions Interventions: Breast feeding basics reviewed;Assisted with latch;Skin to skin;Hand express;Breast compression;Support pillows;Adjust position  Lactation Tools Discussed/Used Tools: 65F feeding tube / Syringe Pump Review: Setup, frequency, and cleaning   Consult Status Consult Status: Follow-up Date: 03/13/20 Follow-up type: In-patient    Walker Shadow 03/12/2020, 3:19 PM

## 2020-03-15 ENCOUNTER — Ambulatory Visit: Payer: Self-pay

## 2020-03-15 NOTE — Lactation Note (Signed)
This note was copied from a baby's chart. Lactation Consultation Note  Patient Name: Girl Takiya Belmares XIPJA'S Date: 03/15/2020 Reason for consult: Follow-up assessment;NICU baby;Late-preterm 34-36.6wks;Infant < 6lbs Type of Endocrine Disorder?: Diabetes  1100 - 1113 - Lactation followed up with Ms. Smouse at Product manager appointed feeding time. Ms. Heskett arrived at the same time as lactation. She states that baby has been making progress with breast feeding and latched yesterday.  I offered to assist, and she consented. I observed her practice lick and learn in the football hold on the right breast. I offered to lean her chair back and provide pillow support. Baby was mouthing and licking but not actively attempting to latch. Ms. Twaddle hand expressed milk into baby's mouth. Ms. Dubiel stated that baby is making progress; she praised baby and smiled throughout the appointment.  Ms. Aull had her nipple shield next to her, but she states that she discontinued use of it, noting that Kandice Moos prefers the breast.  I praised her for her efforts and for her attentiveness with baby, and encouraged her to continue offering the breast for practice. At this time, baby's behavior and latch appeared appropriate. Lactation to follow up around [redacted] weeks GA or upon request.  We reviewed pumping practices. Ms. Kunst reports obtained between 3 and 5 ounces with her pumps. The longest interval between is 4 hours at night. She is pumping up to 40 minutes at a time and remarks that she is feeling tied to the pump. I encouraged her to shorten duration to 20 minute sessions with short comfort pumps between as needed. I encouraged her to monitor her pumping output and adjust session intervals as needed to help her maintain her milk.  Maternal Data Formula Feeding for Exclusion: No Has patient been taught Hand Expression?: Yes Does the patient have breastfeeding experience prior to this delivery?:  Yes  Feeding Feeding Type: Breast Milk  LATCH Score Latch: Repeated attempts needed to sustain latch, nipple held in mouth throughout feeding, stimulation needed to elicit sucking reflex.  Audible Swallowing: None  Type of Nipple: Everted at rest and after stimulation  Comfort (Breast/Nipple): Soft / non-tender  Hold (Positioning): No assistance needed to correctly position infant at breast.  LATCH Score: 7  Interventions Interventions: Breast feeding basics reviewed;Hand express;Skin to skin;Support pillows;Adjust position;Expressed milk  Lactation Tools Discussed/Used Tools: 56F feeding tube / Syringe Breast pump type: Double-Electric Breast Pump Pump Review: Setup, frequency, and cleaning   Consult Status Consult Status: Follow-up Date: 03/20/20 Follow-up type: In-patient    Walker Shadow 03/15/2020, 11:36 AM

## 2020-03-23 ENCOUNTER — Other Ambulatory Visit: Payer: Medicaid Other

## 2020-03-23 ENCOUNTER — Other Ambulatory Visit: Payer: Self-pay

## 2020-03-23 DIAGNOSIS — Z20822 Contact with and (suspected) exposure to covid-19: Secondary | ICD-10-CM

## 2020-03-26 LAB — NOVEL CORONAVIRUS, NAA: SARS-CoV-2, NAA: NOT DETECTED

## 2020-04-04 ENCOUNTER — Ambulatory Visit: Payer: Self-pay

## 2020-04-04 NOTE — Lactation Note (Addendum)
This note was copied from a baby's chart. Lactation Consultation Note  Patient Name: Debra Roach XIPJA'S Date: 04/04/2020 Reason for consult: Follow-up assessment;NICU baby;Early term 37-38.6wks type 2 Diabetic Mom  LC in to visit with P46 Mom of 21 week old AGA [redacted]w[redacted]d infant in the NICU.  Baby has been consistently gaining 20-40 gm per day without gavage feedings last 24 hrs.    Mom bottle feeding baby formula.  Asked  Mom how she was doing with pumping and Mom stated she has a "ton of milk in the freezer at home".  Milk lab has requested she bring some in.  Reminded Mom of pump in baby's room that she can use when here visiting with baby.  Mom had to quarantine due to a sibling at home becoming Covid +.  Mom states she is more focused on baby getting her EBM by bottle, but eventually may try again to latch baby to the breast.  Mom states she will be returning to work and giving bottles anyway.    Mom states she expresses 6-7 oz each morning and then expresses 2-4 oz per pumping during the day.  Praised Mom for her commitment to providing EBM for her baby.  Mom encouraged to ask for assistance if she would like to latch baby to the breast.  Have her baby's nurse call '3rd floor lactation consultant" prn.  Encouraged STS with baby.  Interventions Interventions: Breast feeding basics reviewed;Skin to skin;Breast massage;Hand express;DEBP  Lactation Tools Discussed/Used Tools: Pump;Bottle Breast pump type: Double-Electric Breast Pump   Consult Status Consult Status: Follow-up Date: 04/11/20 Follow-up type: In-patient    Judee Clara 04/04/2020, 12:16 PM

## 2020-04-08 ENCOUNTER — Ambulatory Visit: Payer: Self-pay

## 2020-04-08 NOTE — Lactation Note (Signed)
This note was copied from a baby's chart. Lactation Consultation Note  Patient Name: Debra Roach Date: 04/08/2020 Reason for consult: Follow-up assessment;NICU baby   Mom holding infant in blankets.  Mom is pumping and will occasionally offer infant the breast.    LC provided mom with a hand pump per request.  Size 24 flange appropriate for mom.  LC took pump apart and checked fit. Cleaning reviewed with mom.    Mom was encouraged to set up OP LC appt. In order to gain support in latching infant after DC.  She is aware of BFSG and phone line.    Mom was praised for pumping efforts.   Plan is for infant to go home tomorrow and is working on wt. Gain for 1 more day.  Maternal Data    Feeding    LATCH Score                   Interventions Interventions: Hand pump;Breast feeding basics reviewed  Lactation Tools Discussed/Used Tools: Pump   Consult Status Consult Status: Follow-up Date: 04/09/20 Follow-up type: In-patient    Maryruth Hancock [facility] 04/08/2020, 1:28 PM

## 2020-04-09 ENCOUNTER — Ambulatory Visit: Payer: Self-pay

## 2020-04-09 NOTE — Lactation Note (Signed)
This note was copied from a baby's chart. Lactation Consultation Note  Patient Name: Debra Roach TWSFK'C Date: 04/09/2020   Mother reports that she is hoping to go home today. She is comfortable with pumping and has plenty of bottles for storage and she ha understanding of milk storage guidelines. Mother reports she denies having any breastfeeding questions or pumping concerns today.  Mother is aware of available LC services and phone numbers to contact LC if needed.   Maternal Data    Feeding Feeding Type: Breast Milk Nipple Type: Dr. Levert Feinstein [facility]  LATCH Score                   Interventions    Lactation Tools Discussed/Used     Consult Status      Debra Roach 04/09/2020, 12:29 PM

## 2021-05-10 ENCOUNTER — Ambulatory Visit
Admission: EM | Admit: 2021-05-10 | Discharge: 2021-05-10 | Disposition: A | Discharge status: Home / Self Care | Payer: BC Managed Care – PPO

## 2021-05-10 ENCOUNTER — Other Ambulatory Visit: Payer: Self-pay

## 2021-05-10 DIAGNOSIS — R6889 Other general symptoms and signs: Secondary | ICD-10-CM

## 2021-05-10 DIAGNOSIS — J069 Acute upper respiratory infection, unspecified: Secondary | ICD-10-CM

## 2021-05-10 LAB — POCT INFLUENZA A/B
Influenza A, POC: NEGATIVE
Influenza B, POC: NEGATIVE

## 2021-05-10 MED ORDER — ACETAMINOPHEN 325 MG PO TABS
650.0000 mg | ORAL_TABLET | Freq: Once | ORAL | Status: AC
  Administered 2021-05-10: 650 mg via ORAL

## 2021-05-10 MED ORDER — OSELTAMIVIR PHOSPHATE 75 MG PO CAPS: 75.0000 mg | ORAL_CAPSULE | Freq: Two times a day (BID) | ORAL | 0 refills | Status: AC

## 2021-05-10 NOTE — ED Triage Notes (Signed)
Two day h/o fever, body aches, chills, fatigue and posterior neck and bilateral trapezius stiffness. Sxs seem to decrease during the day and increase in the evening after work. 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 Has been taking motrin and tylenol without a decrease in fever. Has also been taking theraflu with some relief.

## 2021-05-10 NOTE — ED Provider Notes (Signed)
[facility]    CSN: [id number] Arrival date & time: 05/10/21  1136      History   Chief Complaint Chief Complaint  Patient presents with   Fever   Generalized Body Aches    HPI Debra Roach is a 40 y.o. female.   Patient here today for evaluation of nasal congestion and drainage, sore throat, fevers, chills and body aches that started 2 days ago.  She does not report any nausea, vomiting or diarrhea.  She has tried taking TheraFlu with mild relief of symptoms.  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  The history is provided by the patient.  Fever Associated symptoms: chills, congestion, cough, myalgias and sore throat   Associated symptoms: no diarrhea, no ear pain, no nausea and no vomiting    Past Medical History:  Diagnosis Date   Blood transfusion without reported diagnosis    following miscarriage 2007   Diabetes mellitus without complication (HCC)    Type 2 diabetes x 15 years   Medical history non-contributory    Pregnancy induced hypertension    Preterm labor    history of preterm delivery at 30 weeks with 1st child   Vaginal Pap smear, abnormal    in high school not surgrically treated    Patient Active Problem List   Diagnosis Date Noted   Preterm premature rupture of membranes (PPROM) with unknown onset of labor 03/01/2020    History reviewed. No pertinent surgical history.  OB History     Gravida  4   Para  3   Term  1   Preterm  2   AB  1   Living  3      SAB  1   IAB  0   Ectopic  0   Multiple  0   Live Births  3            Home Medications    Prior to Admission medications   Medication Sig Start Date End Date Taking? Authorizing Provider  oseltamivir (TAMIFLU) 75 MG capsule Take 1 capsule (75 mg total) by mouth every 12 (twelve) hours. 05/10/21  Yes Tomi Bamberger, PA-C  acetaminophen (TYLENOL) 325 MG tablet Take 2 tablets (650 mg total) by mouth every 4 (four) hours  as needed (for pain scale < 4). 03/09/20   Huel Cote, MD  ibuprofen (ADVIL) 600 MG tablet Take 1 tablet (600 mg total) by mouth every 6 (six) hours. 03/09/20   Huel Cote, MD  insulin aspart (NOVOLOG) 100 UNIT/ML injection Inject 3 Units into the skin 3 (three) times daily with meals. 03/09/20   Huel Cote, MD  insulin detemir (LEVEMIR) 100 UNIT/ML injection Inject 0.35 mLs (35 Units total) into the skin 2 (two) times daily. 03/09/20   Huel Cote, MD  medroxyPROGESTERone (DEPO-PROVERA) 150 MG/ML injection Inject into the muscle. 05/08/21   [provider]  metFORMIN (GLUCOPHAGE-XR) 500 MG 24 hr tablet Take 1 tablet (500 mg total) by mouth 2 (two) times daily with a meal. 03/09/20   Huel Cote, MD  Prenatal Vit-Fe Fumarate-FA (PRENATAL MULTIVITAMIN) TABS tablet Take 1 tablet by mouth daily at 12 noon.    [provider]  SYNJARDY XR 12.11-998 MG TB24 Take by mouth. 12/10/20   [provider]    Family History History reviewed. No pertinent family history.  Social History Social History   Tobacco Use   Smoking status: Former    Types: Cigarettes  Quit date: 08/02/2019    Years since quitting: 1.7   Smokeless tobacco: Never   Tobacco comments:    pt converted to vaping and then stopped when found out she was pregnant  Vaping Use   Vaping Use: Former   Quit date: 08/02/2019   Substances: Nicotine, Flavoring  Substance Use Topics   Alcohol use: Never   Drug use: Never     Allergies   Patient has no known allergies.   Review of Systems Review of Systems  Constitutional:  Positive for chills and fever.  HENT:  Positive for congestion, sinus pressure and sore throat. Negative for ear pain.   Eyes:  Negative for discharge and redness.  Respiratory:  Positive for cough. Negative for shortness of breath and wheezing.   Gastrointestinal:  Negative for abdominal pain, diarrhea, nausea and vomiting.  Musculoskeletal:  Positive for  myalgias.    Physical Exam Triage Vital Signs ED Triage Vitals  Enc Vitals Group     BP 05/10/21 1239 137/84     Pulse Rate 05/10/21 1239 (!) 109     Resp 05/10/21 1239 18     Temp 05/10/21 1239 (!) 101.6 F (38.7 C)     Temp Source 05/10/21 1239 Oral     SpO2 05/10/21 1239 98 %     Weight --      Height --      Head Circumference --      Peak Flow --      Pain Score 05/10/21 1242 9     Pain Loc --      Pain Edu? --      Excl. in GC? --    No data found.  Updated Vital Signs BP 137/84 (BP Location: Right Arm)   Pulse (!) 109   Temp (!) 101.6 F (38.7 C) (Oral)   Resp 18   SpO2 98%   Breastfeeding Yes   Physical Exam Vitals and nursing note reviewed.  Constitutional:      General: She is not in acute distress.    Appearance: Normal appearance. She is not ill-appearing.  HENT:     Head: Normocephalic and atraumatic.     Nose: Congestion present.     Mouth/Throat:     Mouth: Mucous membranes are moist.     Pharynx: No oropharyngeal exudate or posterior oropharyngeal erythema.  Eyes:     Conjunctiva/sclera: Conjunctivae normal.  Cardiovascular:     Rate and Rhythm: Normal rate and regular rhythm.     Heart sounds: Normal heart sounds. No murmur heard. Pulmonary:     Effort: Pulmonary effort is normal. No respiratory distress.     Breath sounds: Normal breath sounds. No wheezing, rhonchi or rales.  Skin:    General: Skin is warm and dry.  Neurological:     Mental Status: She is alert.  Psychiatric:        Mood and Affect: Mood normal.        Thought Content: Thought content normal.     UC Treatments / Results  Labs (all labs ordered are listed, but only abnormal results are displayed) Labs Reviewed  COVID-19, FLU A+B NAA  POCT INFLUENZA A/B    EKG   Radiology No results found.  Procedures Procedures (including critical care time)  Medications Ordered in UC Medications  acetaminophen (TYLENOL) tablet 650 mg (650 mg Oral Given 05/10/21 1255)     Initial Impression / Assessment and Plan / UC Course  I have reviewed the triage vital  signs and the nursing notes.  Pertinent labs & imaging results that were available during my care of the patient were reviewed by me and considered in my medical decision making (see chart for details).  Flu test negative in office however will order COVID and flu PCR screening.  Will treat with Tamiflu and recommended other symptomatic treatment if needed.  Encouraged follow-up if symptoms fail to improve or worsen.  Final Clinical Impressions(s) / UC Diagnoses   Final diagnoses:  Flu-like symptoms  Acute upper respiratory infection   Discharge Instructions   None    ED Prescriptions     Medication Sig Dispense Auth. Provider   oseltamivir (TAMIFLU) 75 MG capsule Take 1 capsule (75 mg total) by mouth every 12 (twelve) hours. 10 capsule Tomi Bamberger, PA-C      PDMP not reviewed this encounter.   Tomi Bamberger, PA-C 05/10/21 1329

## 2021-05-11 LAB — COVID-19, FLU A+B NAA
Influenza A, NAA: NOT DETECTED
Influenza B, NAA: NOT DETECTED
SARS-CoV-2, NAA: NOT DETECTED

## 2021-08-04 IMAGING — DX DG CHEST 2V
3 series · 3 of 3 positions shown · non-contrast
Comparison: None.

CLINICAL DATA: Postpartum fever of unknown etiology.

EXAM:
CHEST - 2 VIEW

[x chest ap (1 of 2)]
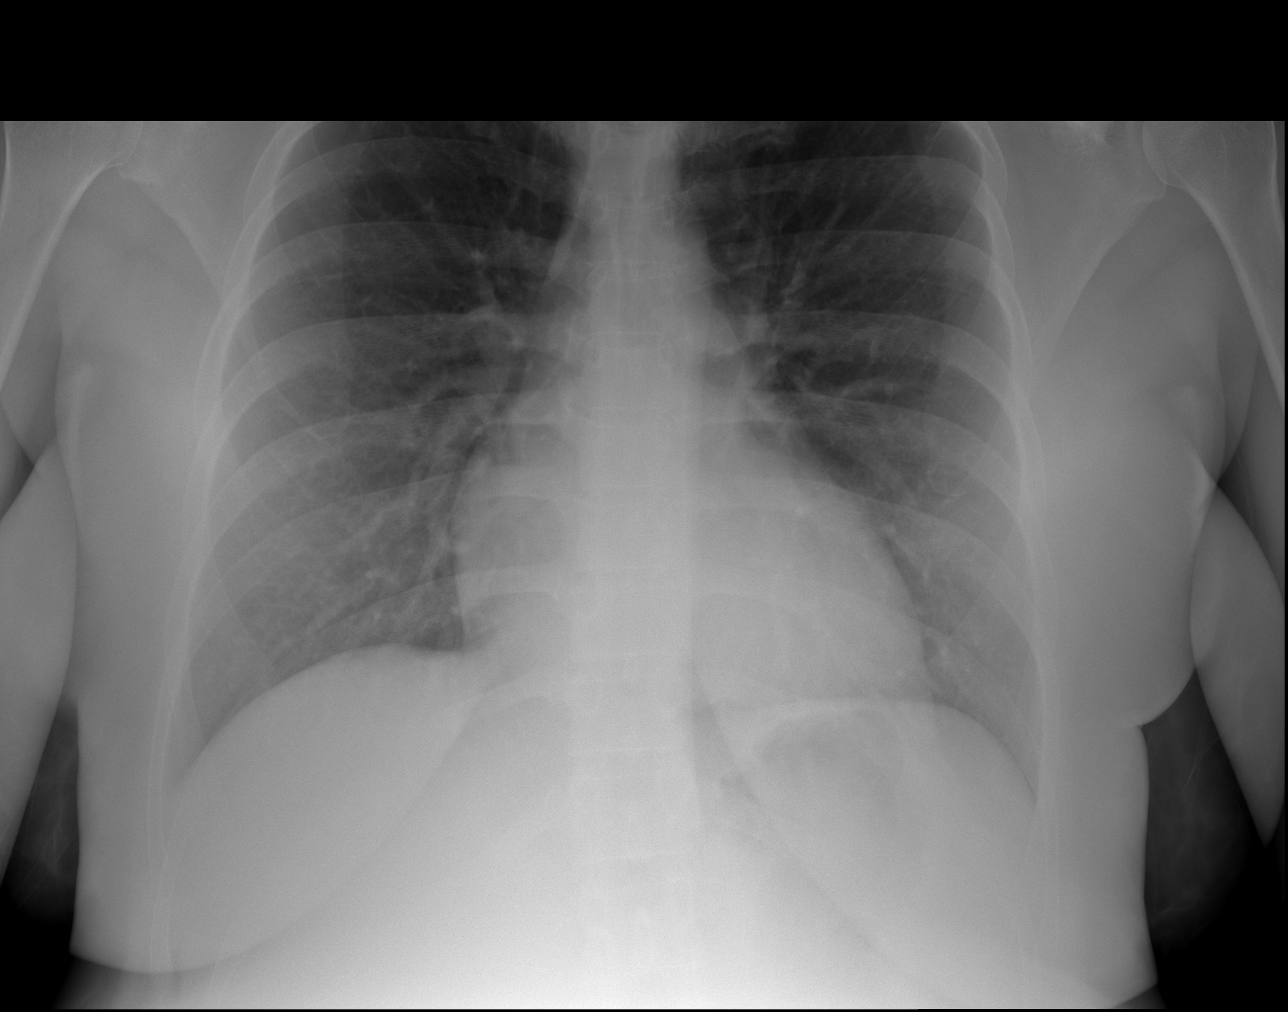

[x chest ap (2 of 2)]
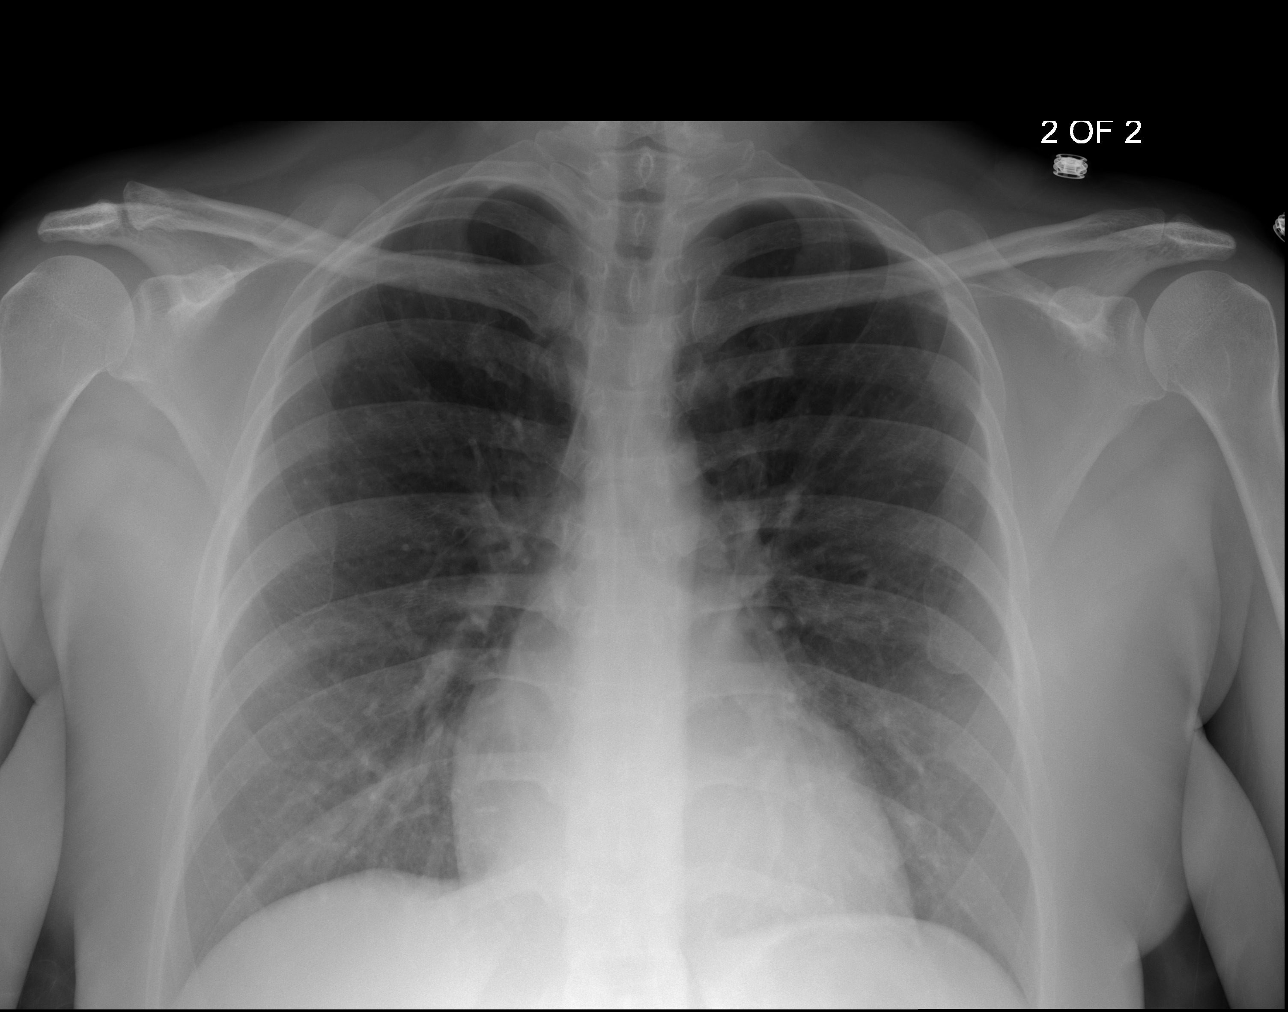

[w chest lat]
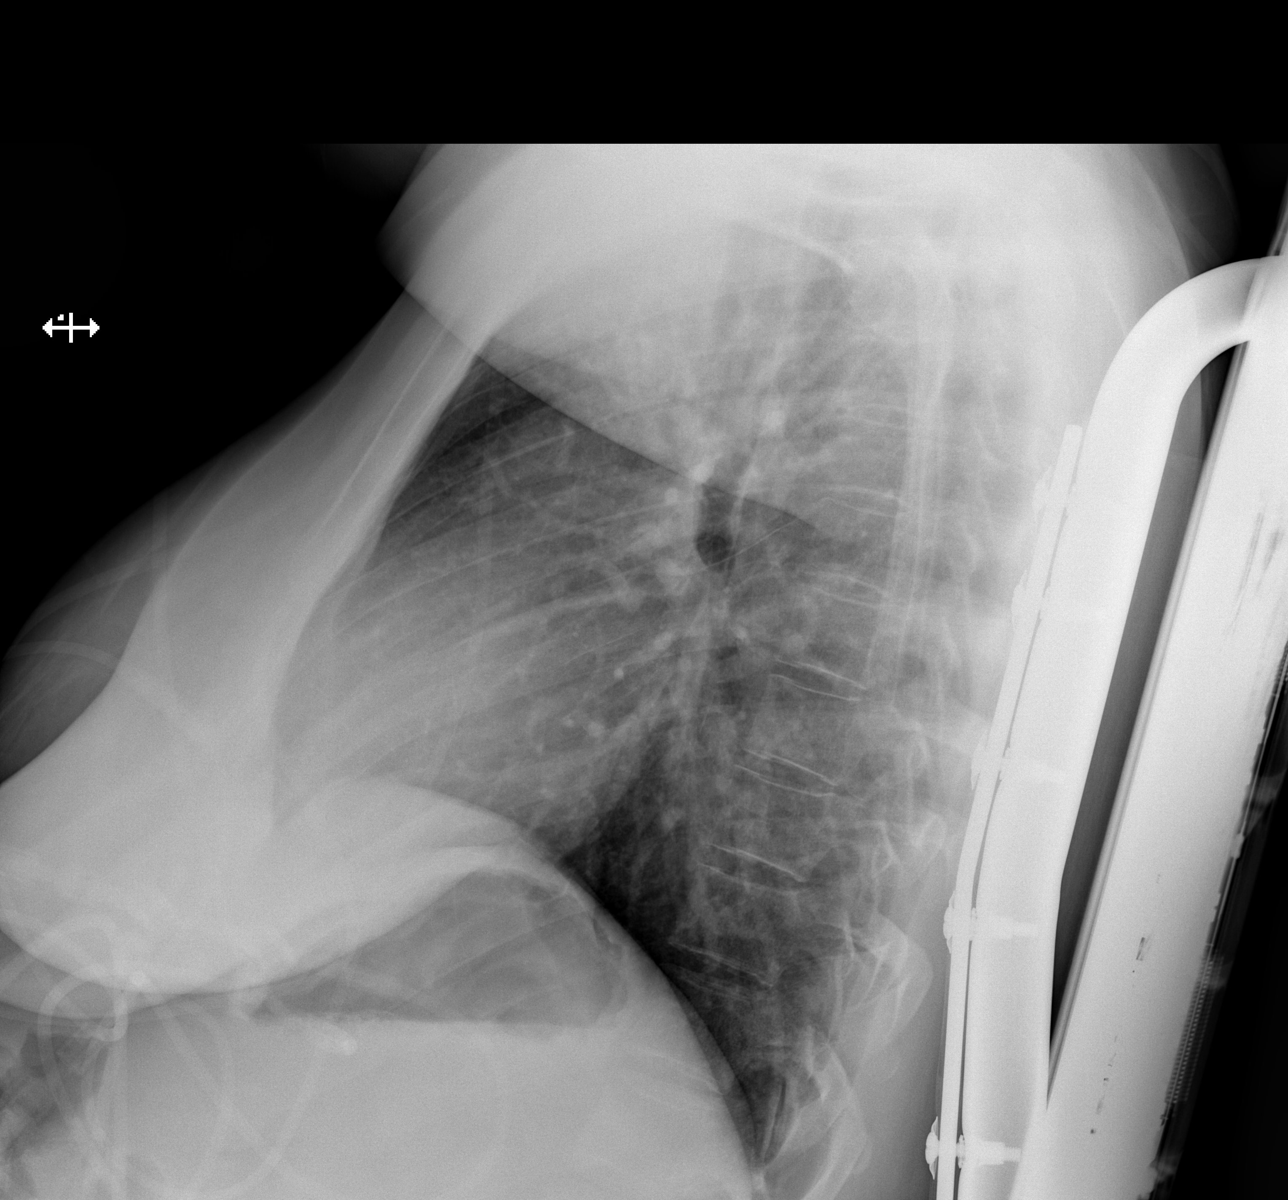

[3 of 3 positions shown; findings below may reference images not displayed]

FINDINGS: The heart size and mediastinal contours are within normal limits.
Both lungs are clear. The visualized skeletal structures are
unremarkable.
IMPRESSION: No active cardiopulmonary disease.
